# Patient Record
Sex: Female | Born: 1985 | Race: Black or African American | Hispanic: No | Marital: Married | Smoking: Never smoker
Health system: Southern US, Community
[De-identification: ages and names within clinical notes are randomized; demographics above are authoritative.]

## PROBLEM LIST (undated history)

## (undated) ENCOUNTER — Inpatient Hospital Stay (HOSPITAL_COMMUNITY): Payer: Self-pay

## (undated) DIAGNOSIS — N390 Urinary tract infection, site not specified: Secondary | ICD-10-CM

## (undated) DIAGNOSIS — F419 Anxiety disorder, unspecified: Secondary | ICD-10-CM

## (undated) DIAGNOSIS — T7840XA Allergy, unspecified, initial encounter: Secondary | ICD-10-CM

## (undated) DIAGNOSIS — N83209 Unspecified ovarian cyst, unspecified side: Secondary | ICD-10-CM

## (undated) DIAGNOSIS — D649 Anemia, unspecified: Secondary | ICD-10-CM

## (undated) DIAGNOSIS — F32A Depression, unspecified: Secondary | ICD-10-CM

## (undated) DIAGNOSIS — R519 Headache, unspecified: Secondary | ICD-10-CM

## (undated) DIAGNOSIS — K219 Gastro-esophageal reflux disease without esophagitis: Secondary | ICD-10-CM

## (undated) DIAGNOSIS — M1990 Unspecified osteoarthritis, unspecified site: Secondary | ICD-10-CM

## (undated) HISTORY — PX: TONSILLECTOMY: SUR1361

## (undated) HISTORY — PX: WISDOM TOOTH EXTRACTION: SHX21

## (undated) HISTORY — DX: Gastro-esophageal reflux disease without esophagitis: K21.9

## (undated) HISTORY — DX: Allergy, unspecified, initial encounter: T78.40XA

## (undated) HISTORY — DX: Anxiety disorder, unspecified: F41.9

## (undated) HISTORY — DX: Depression, unspecified: F32.A

## (undated) HISTORY — DX: Unspecified osteoarthritis, unspecified site: M19.90

---
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
## 2014-08-28 DIAGNOSIS — K649 Unspecified hemorrhoids: Secondary | ICD-10-CM

## 2014-08-28 HISTORY — DX: Unspecified hemorrhoids: K64.9

## 2014-11-19 DIAGNOSIS — K649 Unspecified hemorrhoids: Secondary | ICD-10-CM | POA: Insufficient documentation

## 2021-04-28 DIAGNOSIS — Z419 Encounter for procedure for purposes other than remedying health state, unspecified: Secondary | ICD-10-CM | POA: Diagnosis not present

## 2021-05-28 DIAGNOSIS — Z419 Encounter for procedure for purposes other than remedying health state, unspecified: Secondary | ICD-10-CM | POA: Diagnosis not present

## 2021-06-28 DIAGNOSIS — Z419 Encounter for procedure for purposes other than remedying health state, unspecified: Secondary | ICD-10-CM | POA: Diagnosis not present

## 2021-07-28 DIAGNOSIS — Z419 Encounter for procedure for purposes other than remedying health state, unspecified: Secondary | ICD-10-CM | POA: Diagnosis not present

## 2021-07-29 DIAGNOSIS — Z131 Encounter for screening for diabetes mellitus: Secondary | ICD-10-CM | POA: Diagnosis not present

## 2021-07-29 DIAGNOSIS — R5383 Other fatigue: Secondary | ICD-10-CM | POA: Diagnosis not present

## 2021-07-29 DIAGNOSIS — Z7182 Exercise counseling: Secondary | ICD-10-CM | POA: Diagnosis not present

## 2021-07-29 DIAGNOSIS — Z0189 Encounter for other specified special examinations: Secondary | ICD-10-CM | POA: Diagnosis not present

## 2021-07-29 DIAGNOSIS — Z13220 Encounter for screening for lipoid disorders: Secondary | ICD-10-CM | POA: Diagnosis not present

## 2021-07-29 DIAGNOSIS — Z113 Encounter for screening for infections with a predominantly sexual mode of transmission: Secondary | ICD-10-CM | POA: Diagnosis not present

## 2021-07-29 DIAGNOSIS — Z713 Dietary counseling and surveillance: Secondary | ICD-10-CM | POA: Diagnosis not present

## 2021-07-29 DIAGNOSIS — F419 Anxiety disorder, unspecified: Secondary | ICD-10-CM | POA: Diagnosis not present

## 2021-07-29 DIAGNOSIS — Z130 Encounter for screening for diseases of the blood and blood-forming organs and certain disorders involving the immune mechanism: Secondary | ICD-10-CM | POA: Diagnosis not present

## 2021-07-29 DIAGNOSIS — D509 Iron deficiency anemia, unspecified: Secondary | ICD-10-CM | POA: Diagnosis not present

## 2021-07-29 DIAGNOSIS — Z114 Encounter for screening for human immunodeficiency virus [HIV]: Secondary | ICD-10-CM | POA: Diagnosis not present

## 2021-07-29 DIAGNOSIS — Z1329 Encounter for screening for other suspected endocrine disorder: Secondary | ICD-10-CM | POA: Diagnosis not present

## 2021-08-01 DIAGNOSIS — Z3009 Encounter for other general counseling and advice on contraception: Secondary | ICD-10-CM | POA: Diagnosis not present

## 2021-08-01 DIAGNOSIS — Z3202 Encounter for pregnancy test, result negative: Secondary | ICD-10-CM | POA: Diagnosis not present

## 2021-08-01 DIAGNOSIS — Z01419 Encounter for gynecological examination (general) (routine) without abnormal findings: Secondary | ICD-10-CM | POA: Diagnosis not present

## 2021-08-01 DIAGNOSIS — N946 Dysmenorrhea, unspecified: Secondary | ICD-10-CM | POA: Diagnosis not present

## 2021-08-26 ENCOUNTER — Other Ambulatory Visit: Payer: Self-pay | Admitting: Family

## 2021-08-26 ENCOUNTER — Other Ambulatory Visit (HOSPITAL_COMMUNITY): Payer: Self-pay | Admitting: Family

## 2021-08-26 DIAGNOSIS — N946 Dysmenorrhea, unspecified: Secondary | ICD-10-CM

## 2021-08-28 DIAGNOSIS — Z419 Encounter for procedure for purposes other than remedying health state, unspecified: Secondary | ICD-10-CM | POA: Diagnosis not present

## 2021-08-28 NOTE — L&D Delivery Note (Signed)
Delivery Note 36 y.o. G3P1011 at [redacted]w[redacted]d admitted for spontaneous onset of labor, augmented with oxytcoin and AROM.  At 3:04 PM a viable female was delivered via Vaginal, Spontaneous (Presentation: Right Occiput Anterior).  APGAR: 8, 9; weight 7 lb 5.8 oz (3340 g).   Placenta status: Spontaneous, Intact.  Cord: 3 vessels with the following complications: None.  Cord pH: not collected  Anesthesia: Epidural Episiotomy: None Lacerations: None Suture Repair:  none Est. Blood Loss (mL): 17  Mom to postpartum.  Baby to Couplet care / Skin to Skin.  Liliane Channel MD MPH OB Fellow, [place] [facility] [place] 06/06/2022

## 2021-09-01 ENCOUNTER — Ambulatory Visit (HOSPITAL_COMMUNITY): Payer: Medicaid Other

## 2021-09-06 DIAGNOSIS — Z3009 Encounter for other general counseling and advice on contraception: Secondary | ICD-10-CM | POA: Diagnosis not present

## 2021-09-06 DIAGNOSIS — Z3200 Encounter for pregnancy test, result unknown: Secondary | ICD-10-CM | POA: Diagnosis not present

## 2021-09-06 DIAGNOSIS — Z113 Encounter for screening for infections with a predominantly sexual mode of transmission: Secondary | ICD-10-CM | POA: Diagnosis not present

## 2021-09-13 ENCOUNTER — Ambulatory Visit (HOSPITAL_COMMUNITY): Payer: Medicaid Other

## 2021-09-14 ENCOUNTER — Ambulatory Visit (HOSPITAL_COMMUNITY)
Admission: RE | Admit: 2021-09-14 | Discharge: 2021-09-14 | Disposition: A | Discharge status: Home / Self Care | Payer: PRIVATE HEALTH INSURANCE | Source: Ambulatory Visit | Attending: Family | Admitting: Family

## 2021-09-14 ENCOUNTER — Other Ambulatory Visit: Payer: Self-pay

## 2021-09-14 DIAGNOSIS — N946 Dysmenorrhea, unspecified: Secondary | ICD-10-CM | POA: Insufficient documentation

## 2021-09-14 DIAGNOSIS — N888 Other specified noninflammatory disorders of cervix uteri: Secondary | ICD-10-CM | POA: Diagnosis not present

## 2021-09-28 DIAGNOSIS — Z419 Encounter for procedure for purposes other than remedying health state, unspecified: Secondary | ICD-10-CM | POA: Diagnosis not present

## 2021-10-21 DIAGNOSIS — Z3201 Encounter for pregnancy test, result positive: Secondary | ICD-10-CM | POA: Diagnosis not present

## 2021-10-21 DIAGNOSIS — F419 Anxiety disorder, unspecified: Secondary | ICD-10-CM | POA: Diagnosis not present

## 2021-10-26 DIAGNOSIS — Z419 Encounter for procedure for purposes other than remedying health state, unspecified: Secondary | ICD-10-CM | POA: Diagnosis not present

## 2021-10-28 ENCOUNTER — Inpatient Hospital Stay (HOSPITAL_COMMUNITY)
Admission: AD | Admit: 2021-10-28 | Discharge: 2021-10-28 | Disposition: A | Discharge status: Home / Self Care | Payer: PRIVATE HEALTH INSURANCE | Attending: Obstetrics and Gynecology | Admitting: Obstetrics and Gynecology

## 2021-10-28 ENCOUNTER — Encounter (HOSPITAL_COMMUNITY): Payer: Self-pay

## 2021-10-28 DIAGNOSIS — O219 Vomiting of pregnancy, unspecified: Secondary | ICD-10-CM | POA: Insufficient documentation

## 2021-10-28 DIAGNOSIS — Z3A01 Less than 8 weeks gestation of pregnancy: Secondary | ICD-10-CM | POA: Diagnosis not present

## 2021-10-28 HISTORY — DX: Anemia, unspecified: D64.9

## 2021-10-28 LAB — URINALYSIS, ROUTINE W REFLEX MICROSCOPIC
Bilirubin Urine: NEGATIVE
Glucose, UA: NEGATIVE mg/dL
Hgb urine dipstick: NEGATIVE
Ketones, ur: NEGATIVE mg/dL
Leukocytes,Ua: NEGATIVE
Nitrite: NEGATIVE
Protein, ur: NEGATIVE mg/dL
Specific Gravity, Urine: 1.012 (ref 1.005–1.030)
pH: 6 (ref 5.0–8.0)

## 2021-10-28 LAB — POCT PREGNANCY, URINE: Preg Test, Ur: POSITIVE — AB

## 2021-10-28 MED ORDER — ACETAMINOPHEN 500 MG PO TABS
1000.0000 mg | ORAL_TABLET | Freq: Once | ORAL | Status: AC
  Administered 2021-10-28: 1000 mg via ORAL
  Filled 2021-10-28: qty 2

## 2021-10-28 MED ORDER — BONJESTA 20-20 MG PO TBCR: 1.0000 | EXTENDED_RELEASE_TABLET | Freq: Every day | ORAL | 1 refills | Status: DC

## 2021-10-28 MED ORDER — METOCLOPRAMIDE HCL 10 MG PO TABS: 10.0000 mg | ORAL_TABLET | Freq: Four times a day (QID) | ORAL | 0 refills | Status: DC

## 2021-10-28 MED ORDER — PROMETHAZINE HCL 25 MG PO TABS
25.0000 mg | ORAL_TABLET | Freq: Once | ORAL | Status: AC
Start: 2021-10-28 — End: 2021-10-28
  Administered 2021-10-28: 25 mg via ORAL
  Filled 2021-10-28: qty 1

## 2021-10-28 MED ORDER — METOCLOPRAMIDE HCL 10 MG PO TABS
10.0000 mg | ORAL_TABLET | Freq: Once | ORAL | Status: AC
  Administered 2021-10-28: 10 mg via ORAL
  Filled 2021-10-28: qty 1

## 2021-10-28 MED ORDER — PROMETHAZINE HCL 12.5 MG PO TABS: 12.5000 mg | ORAL_TABLET | Freq: Four times a day (QID) | ORAL | 0 refills | Status: DC | PRN

## 2021-10-28 NOTE — MAU Note (Signed)
..  Beverly Joseph is a 36 y.o. at Unknown here in MAU reporting: N/V since Monday. Pt reports having only one episode of emesis that was mostly acid, and just nauseous and spitting since. Pt eat a hard boiled egg, english muffin, and hot chocolate this am for breakfast, and ravioli for lunch. Pt states she has not drink any water, just 4 oz of cranberry. Not able to drink without being nauseous. Pt reports HA , not taking pain medication. Pt denies VB, LOF,  ?Oct 21, 2021 at [place] confirmed pregnancy. ?LMP: 09/08/2021 ?Onset of complaint: 0630 ?Pain score: 4 HA ?Vitals:  ? 10/28/21 1934  ?BP: (!) 101/54  ?Pulse: 79  ?Resp: 18  ?Temp: 98.7 ?F (37.1 ?C)  ?SpO2: 99%  ?   ? ?Lab orders placed from triage:  UA, POCT preg  ? ?

## 2021-10-28 NOTE — MAU Provider Note (Signed)
?History  ?  ? ?CSN: [id number] ? ?Arrival date and time: 10/28/21 1827 ? ? Event Date/Time  ? First Provider Initiated Contact with Patient 10/28/21 2006   ?  ? ?Chief Complaint  ?Patient presents with  ? Emesis  ? Nausea  ? ?Beverly Joseph is a 36 y.o. G3P10 at [redacted]w[redacted]d by Definite LMP of Sep 08, 2021 who receives care at [facility].  She presents  today for Emesis and Nausea.  She states her symptoms started a couple of days ago.  She states she hasn't had a lot of vomiting incidents, but a lot of smells and foods make her nauseous.  Patient states she has not tried any OTC medications and has thrown up once around 1830 and "it was just acid."  She states she also been having a lot of "straining and spitting."   ? ? ?OB History   ? ? Gravida  ?3  ? Para  ?1  ? Term  ?1  ? Preterm  ?   ? AB  ?1  ? Living  ?1  ?  ? ? SAB  ?1  ? IAB  ?   ? Ectopic  ?   ? Multiple  ?   ? Live Births  ?1  ?   ?  ?  ? ? ?Past Medical History:  ?Diagnosis Date  ? Anemia   ? ? ?Past Surgical History:  ?Procedure Laterality Date  ? WISDOM TOOTH EXTRACTION    ? ? ?History reviewed. No pertinent family history. ? ?Social History  ? ?Tobacco Use  ? Smoking status: Never  ? Smokeless tobacco: Never  ?Substance Use Topics  ? Alcohol use: Never  ? Drug use: Never  ? ? ?Allergies:  ?Allergies  ?Allergen Reactions  ? Penicillins Nausea And Vomiting  ? ? ?Medications Prior to Admission  ?Medication Sig Dispense Refill Last Dose  ? ferrous sulfate 325 (65 FE) MG EC tablet Take 325 mg by mouth 3 (three) times daily with meals.   10/28/2021  ? Prenatal Vit-Fe Fumarate-FA (PRENATAL MULTIVITAMIN) TABS tablet Take 1 tablet by mouth daily at 12 noon.   10/28/2021  ? ? ?Review of Systems  ?Constitutional:  Negative for chills and fever.  ?Gastrointestinal:  Positive for nausea. Negative for abdominal pain and vomiting.  ?Genitourinary:  Negative for difficulty urinating, dysuria, vaginal bleeding and vaginal discharge.  ?Musculoskeletal:  Negative for back pain.   ?Neurological:  Positive for headaches. Negative for dizziness and light-headedness.  ?Physical Exam  ? ?Blood pressure (!) 102/55, pulse 69, temperature 98.7 ?F (37.1 ?C), temperature source Oral, resp. rate 14, height 5\' 5"  (1.651 m), weight 62.2 kg, last menstrual period 09/08/2021, SpO2 99 %. ? ?Physical Exam ?Vitals reviewed.  ?Constitutional:   ?   Appearance: Normal appearance.  ?HENT:  ?   Head: Normocephalic and atraumatic.  ?Eyes:  ?   Conjunctiva/sclera: Conjunctivae normal.  ?Cardiovascular:  ?   Rate and Rhythm: Normal rate.  ?   Pulses: Normal pulses.  ?Pulmonary:  ?   Effort: Pulmonary effort is normal. No respiratory distress.  ?   Breath sounds: Normal breath sounds.  ?Abdominal:  ?   General: Bowel sounds are normal.  ?   Palpations: Abdomen is soft.  ?   Tenderness: There is no abdominal tenderness.  ?Musculoskeletal:     ?   General: Normal range of motion.  ?   Cervical back: Normal range of motion.  ?Skin: ?   General: Skin is warm and dry.  ?  Neurological:  ?   Mental Status: She is alert and oriented to person, place, and time.  ?Psychiatric:     ?   Mood and Affect: Mood normal.  ? ? ?MAU Course  ?Procedures ?Results for orders placed or performed during the hospital encounter of 10/28/21 (from the past 24 hour(s))  ?Urinalysis, Routine w reflex microscopic     Status: None  ? Collection Time: 10/28/21  7:44 PM  ?Result Value Ref Range  ? Color, Urine YELLOW YELLOW  ? APPearance CLEAR CLEAR  ? Specific Gravity, Urine 1.012 1.005 - 1.030  ? pH 6.0 5.0 - 8.0  ? Glucose, UA NEGATIVE NEGATIVE mg/dL  ? Hgb urine dipstick NEGATIVE NEGATIVE  ? Bilirubin Urine NEGATIVE NEGATIVE  ? Ketones, ur NEGATIVE NEGATIVE mg/dL  ? Protein, ur NEGATIVE NEGATIVE mg/dL  ? Nitrite NEGATIVE NEGATIVE  ? Leukocytes,Ua NEGATIVE NEGATIVE  ?Pregnancy, urine POC     Status: Abnormal  ? Collection Time: 10/28/21  7:45 PM  ?Result Value Ref Range  ? Preg Test, Ur POSITIVE (A) NEGATIVE  ? ? ?MDM ?Exam ?Antiemetic ?Labs:  UA ?Assessment and Plan  ?36 year old ?G3P1011 at 7.1 weeks ?Nausea/Vomiting ? ?-Exam performed.  ?-Discussed treatment with oral medication. ?-Patient agreeable. ?-Reviewed usage of Reglan and Diclegis/Bonjesta at home. ?-Patient requests and given ice water and crackers. ?-Will monitor and reassess.  ? ?Cherre Robins, MSN, CNM ?10/28/2021, 8:06 PM  ? ?Reassessment (9:38 PM) ?-Patient reports some improvement in symptoms. ?-Discussed usage phenergan for additional treatment. ?-Will also give tylenol for HA. ?-Patient without questions.  ? ?Reassessment (10:07 PM) ?-Patient reports improvement. ?-Discussed usage of medications at home. ?-Patient questions if she can eat seafood.  Informed okay, but should do so in moderation. ?-Eating plan provided in AVS. ?-Patient to follow up with primary office. ?-Rx for bonjesta, reglan, and phenergan sent to pharmacy on file.  ?-Encouraged to call primary office or return to MAU if symptoms worsen or with the onset of new symptoms. ?-Discharged to home in improved condition. ? ? ?Cherre Robins MSN, CNM ?Advanced Practice Provider, Center for [employer] ? ?

## 2021-11-18 ENCOUNTER — Encounter (HOSPITAL_COMMUNITY): Payer: Self-pay | Admitting: Obstetrics and Gynecology

## 2021-11-18 ENCOUNTER — Other Ambulatory Visit: Payer: Self-pay

## 2021-11-18 ENCOUNTER — Inpatient Hospital Stay (HOSPITAL_COMMUNITY)
Admission: AD | Admit: 2021-11-18 | Discharge: 2021-11-18 | Disposition: A | Discharge status: Home / Self Care | Payer: PRIVATE HEALTH INSURANCE | Attending: Obstetrics and Gynecology | Admitting: Obstetrics and Gynecology

## 2021-11-18 DIAGNOSIS — R002 Palpitations: Secondary | ICD-10-CM | POA: Insufficient documentation

## 2021-11-18 DIAGNOSIS — O26891 Other specified pregnancy related conditions, first trimester: Secondary | ICD-10-CM | POA: Diagnosis not present

## 2021-11-18 DIAGNOSIS — A084 Viral intestinal infection, unspecified: Secondary | ICD-10-CM

## 2021-11-18 DIAGNOSIS — J069 Acute upper respiratory infection, unspecified: Secondary | ICD-10-CM

## 2021-11-18 DIAGNOSIS — N3090 Cystitis, unspecified without hematuria: Secondary | ICD-10-CM

## 2021-11-18 DIAGNOSIS — R141 Gas pain: Secondary | ICD-10-CM

## 2021-11-18 DIAGNOSIS — R197 Diarrhea, unspecified: Secondary | ICD-10-CM | POA: Insufficient documentation

## 2021-11-18 DIAGNOSIS — Z3A10 10 weeks gestation of pregnancy: Secondary | ICD-10-CM | POA: Diagnosis not present

## 2021-11-18 DIAGNOSIS — J029 Acute pharyngitis, unspecified: Secondary | ICD-10-CM | POA: Diagnosis not present

## 2021-11-18 DIAGNOSIS — R519 Headache, unspecified: Secondary | ICD-10-CM | POA: Insufficient documentation

## 2021-11-18 DIAGNOSIS — Z20822 Contact with and (suspected) exposure to covid-19: Secondary | ICD-10-CM | POA: Diagnosis not present

## 2021-11-18 DIAGNOSIS — Z880 Allergy status to penicillin: Secondary | ICD-10-CM | POA: Diagnosis not present

## 2021-11-18 DIAGNOSIS — O219 Vomiting of pregnancy, unspecified: Secondary | ICD-10-CM | POA: Diagnosis not present

## 2021-11-18 DIAGNOSIS — R12 Heartburn: Secondary | ICD-10-CM | POA: Diagnosis not present

## 2021-11-18 DIAGNOSIS — R1010 Upper abdominal pain, unspecified: Secondary | ICD-10-CM | POA: Insufficient documentation

## 2021-11-18 DIAGNOSIS — R42 Dizziness and giddiness: Secondary | ICD-10-CM | POA: Insufficient documentation

## 2021-11-18 DIAGNOSIS — R0981 Nasal congestion: Secondary | ICD-10-CM | POA: Diagnosis not present

## 2021-11-18 LAB — URINALYSIS, ROUTINE W REFLEX MICROSCOPIC
Bilirubin Urine: NEGATIVE
Glucose, UA: NEGATIVE mg/dL
Hgb urine dipstick: NEGATIVE
Ketones, ur: 20 mg/dL — AB
Nitrite: NEGATIVE
Protein, ur: 30 mg/dL — AB
Specific Gravity, Urine: 1.028 (ref 1.005–1.030)
pH: 5 (ref 5.0–8.0)

## 2021-11-18 LAB — RESP PANEL BY RT-PCR (FLU A&B, COVID) ARPGX2
Influenza A by PCR: NEGATIVE
Influenza B by PCR: NEGATIVE
SARS Coronavirus 2 by RT PCR: NEGATIVE

## 2021-11-18 MED ORDER — DICYCLOMINE HCL 20 MG PO TABS: 20.0000 mg | ORAL_TABLET | ORAL | 0 refills | Status: DC | PRN

## 2021-11-18 MED ORDER — FLUTICASONE PROPIONATE 50 MCG/ACT NA SUSP: 2.0000 | Freq: Every day | NASAL | 0 refills | Status: DC

## 2021-11-18 MED ORDER — VITAMIN B-6 50 MG PO TABS: 50.0000 mg | ORAL_TABLET | Freq: Every day | ORAL | 0 refills | Status: AC

## 2021-11-18 MED ORDER — DICYCLOMINE HCL 20 MG PO TABS
20.0000 mg | ORAL_TABLET | Freq: Once | ORAL | Status: AC
  Administered 2021-11-18: 20 mg via ORAL
  Filled 2021-11-18: qty 1

## 2021-11-18 MED ORDER — FAMOTIDINE 20 MG PO TABS
20.0000 mg | ORAL_TABLET | Freq: Once | ORAL | Status: AC
  Administered 2021-11-18: 20 mg via ORAL
  Filled 2021-11-18: qty 1

## 2021-11-18 MED ORDER — ONDANSETRON 4 MG PO TBDP
4.0000 mg | ORAL_TABLET | Freq: Once | ORAL | Status: AC
  Administered 2021-11-18: 4 mg via ORAL
  Filled 2021-11-18: qty 1

## 2021-11-18 MED ORDER — CEFADROXIL 500 MG PO CAPS
500.0000 mg | ORAL_CAPSULE | Freq: Two times a day (BID) | ORAL | Status: DC
  Administered 2021-11-18: 500 mg via ORAL
  Filled 2021-11-18: qty 1

## 2021-11-18 MED ORDER — LORATADINE 10 MG PO TABS
10.0000 mg | ORAL_TABLET | Freq: Every day | ORAL | 0 refills | Status: DC
Start: 2021-11-19 — End: 2024-04-21

## 2021-11-18 MED ORDER — ONDANSETRON HCL 4 MG PO TABS: 4.0000 mg | ORAL_TABLET | Freq: Every day | ORAL | 0 refills | Status: AC | PRN

## 2021-11-18 MED ORDER — CEFADROXIL 500 MG PO CAPS: 500.0000 mg | ORAL_CAPSULE | Freq: Two times a day (BID) | ORAL | 0 refills | Status: AC

## 2021-11-18 MED ORDER — FAMOTIDINE 20 MG PO TABS: 20.0000 mg | ORAL_TABLET | Freq: Two times a day (BID) | ORAL | 1 refills | Status: DC

## 2021-11-18 MED ORDER — LORATADINE 10 MG PO TABS
10.0000 mg | ORAL_TABLET | Freq: Every day | ORAL | Status: DC
  Administered 2021-11-18: 10 mg via ORAL
  Filled 2021-11-18: qty 1

## 2021-11-18 MED ORDER — FLUTICASONE PROPIONATE 50 MCG/ACT NA SUSP
2.0000 | Freq: Every day | NASAL | Status: DC
  Administered 2021-11-18: 2 via NASAL
  Filled 2021-11-18: qty 16

## 2021-11-18 NOTE — MAU Provider Note (Addendum)
Chief Complaint:  Headache, Diarrhea, and Nausea ? ? Event Date/Time ? First Provider Initiated Contact with Patient 11/18/21 1839   ?  ?HPI: Beverly Joseph is a 36 y.o. G3P1011 at [redacted]w[redacted]d who presents to maternity admissions reporting a 1 day history of diarrhea, rhinorrhea, upper sinus pressure, and sore dry throat. Denies vaginal bleeding, leaking of fluid, fever (took temperature at home and was 97.1 F), and falls. ? ?Yesterday around noon, the patient noted a runny nose. Soon after, she started having diarrhea and sore throat. Later in the evening she started feeling a lot of pressure in her sinuses and especially her left ear, which was also painful. Today, she has been feeling popping in her left ear. The patient also reports nausea and 1 episode of vomiting this morning, which is has been going on throughout her pregnancy. She has had 5 episodes of diarrhea today. She notes palpitations when standing, felt some dizziness and some vision darkening. ? ?The patient has a 59 year old son who she reports also had diarrhea last night. 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 ? ?She has not tried any treatments for her symptoms yet. She has only had 1 bottle of water to drink today. She had breakfast (egg, muffin, banana, tea) and cup soup for lunch. ? ?Pregnancy Course: ?Past Medical History:  ?Diagnosis Date  ? Anemia   ? ?OB History  ?Gravida Para Term Preterm AB Living  ?3 1 1   1 1   ?SAB IAB Ectopic Multiple Live Births  ?1       1  ?  ?# Outcome Date GA Lbr Len/2nd Weight Sex Delivery Anes PTL Lv  ?3 Current           ?2 Term 2016     Vag-Spont   LIV  ?1 SAB 2010          ? ?Past Surgical History:  ?Procedure Laterality Date  ? TONSILLECTOMY    ? WISDOM TOOTH EXTRACTION    ? ?History reviewed. No pertinent family history. ?Social History  ? ?Tobacco Use  ? Smoking status: Never  ? Smokeless tobacco: Never  ?Vaping Use  ? Vaping Use: Never used  ?Substance Use Topics  ? Alcohol use: Never  ? Drug use: Never   ? ?Allergies  ?Allergen Reactions  ? Latex   ? Penicillins Nausea And Vomiting  ? ?Medications Prior to Admission  ?Medication Sig Dispense Refill Last Dose  ? metoCLOPramide (REGLAN) 10 MG tablet Take 1 tablet (10 mg total) by mouth every 6 (six) hours. 30 tablet 0 Past Month  ? Prenatal Vit-Fe Fumarate-FA (PRENATAL MULTIVITAMIN) TABS tablet Take 1 tablet by mouth daily at 12 noon.   11/17/2021  ? promethazine (PHENERGAN) 12.5 MG tablet Take 1 tablet (12.5 mg total) by mouth every 6 (six) hours as needed for nausea or vomiting. 30 tablet 0 Past Month  ? Doxylamine-Pyridoxine ER (BONJESTA) 20-20 MG TBCR Take 1 tablet by mouth at bedtime. 60 tablet 1   ? ferrous sulfate 325 (65 FE) MG EC tablet Take 325 mg by mouth 3 (three) times daily with meals.     ? ? ?I have reviewed patient's Past Medical Hx, Surgical Hx, Family Hx, Social Hx, medications and allergies.  ? ?ROS:  ?Review of Systems  ?Constitutional:  Positive for fatigue. Negative for chills and fever.  ?HENT:  Positive for congestion, ear pain, rhinorrhea, sinus pressure and sore throat.   ?Eyes:  Negative for pain.  ?Respiratory:  Negative  for shortness of breath.   ?Cardiovascular:  Negative for leg swelling.  ?Gastrointestinal:  Positive for abdominal pain, diarrhea (5 episodes), nausea and vomiting (1 episode).  ?Genitourinary:  Negative for flank pain, pelvic pain, vaginal bleeding and vaginal discharge.  ?Musculoskeletal:  Negative for neck stiffness.  ?Skin:  Negative for rash.  ?Neurological:  Positive for dizziness. Negative for headaches.  ? ?Physical Exam  ?Patient Vitals for the past 24 hrs: ? BP Temp Temp src Pulse Resp SpO2  ?11/18/21 2204 (!) 104/56 -- -- 80 -- --  ?11/18/21 1833 (!) 108/56 98.9 ?F (37.2 ?C) Oral (!) 102 16 100 %  ? ? ?Constitutional: Well-developed female, uncomfortable in chair, speaking in shortened sentences. ?HEENT: No TTP over maxillary and frontal sinuses. No tonsillar exudate. Mild erythema in nasopharynx. Nonbulging  and smooth TMs bilaterally. Mild erythema surrounding left TM. ?Cardiovascular: Regular S1/S2. No rubs, murmurs, or gallops. ?Respiratory: Normal work of breathing on room air, lung sounds clear to auscultation bilaterally. ?GI: Abd soft, TTP in epigastrium, gravid appropriate for gestational age. ?MS: Extremities nontender, no edema, normal ROM. ?Neurologic: Alert and oriented x 4. ?GU: No CVA tenderness. ?   ?  ?Labs: ?Results for orders placed or performed during the hospital encounter of 11/18/21 (from the past 24 hour(s))  ?Urinalysis, Routine w reflex microscopic Urine, Clean Catch     Status: Abnormal  ? Collection Time: 11/18/21  6:04 PM  ?Result Value Ref Range  ? Color, Urine AMBER (A) YELLOW  ? APPearance CLOUDY (A) CLEAR  ? Specific Gravity, Urine 1.028 1.005 - 1.030  ? pH 5.0 5.0 - 8.0  ? Glucose, UA NEGATIVE NEGATIVE mg/dL  ? Hgb urine dipstick NEGATIVE NEGATIVE  ? Bilirubin Urine NEGATIVE NEGATIVE  ? Ketones, ur 20 (A) NEGATIVE mg/dL  ? Protein, ur 30 (A) NEGATIVE mg/dL  ? Nitrite NEGATIVE NEGATIVE  ? Leukocytes,Ua MODERATE (A) NEGATIVE  ? RBC / HPF 6-10 0 - 5 RBC/hpf  ? WBC, UA 6-10 0 - 5 WBC/hpf  ? Bacteria, UA MANY (A) NONE SEEN  ? Squamous Epithelial / LPF 0-5 0 - 5  ? Mucus PRESENT   ? ? ?Imaging:  ?No results found. ? ?MAU Course: ?Orders Placed This Encounter  ?Procedures  ? Resp Panel by RT-PCR (Flu A&B, Covid) Nasopharyngeal Swab  ? OB Urine Culture  ? Urinalysis, Routine w reflex microscopic Urine, Clean Catch  ? Orthostatic vital signs  ? ?Meds ordered this encounter  ?Medications  ? ondansetron (ZOFRAN-ODT) disintegrating tablet 4 mg  ? famotidine (PEPCID) tablet 20 mg  ? dicyclomine (BENTYL) tablet 20 mg  ? fluticasone (FLONASE) 50 MCG/ACT nasal spray 2 spray  ? loratadine (CLARITIN) tablet 10 mg  ? cefadroxil (DURICEF) capsule 500 mg  ? ? ?MDM: ?G3P1011 at [redacted]w[redacted]d who presents to maternity admissions reporting a 1 day history of diarrhea, rhinorrhea, upper sinus pressure, and sore dry  throat. Likely viral gastroenteritis and viral upper respiratory infection in addition to suspected UTI (cystitis) ? ?Assessment & Plan: ?Cystitis ?Patient with UA with leukocytes and bacteria. With diffuse abdominal discomfort. No urinary symptoms but given abdominal discomfort and urine will treat. Patient with PCN allergy (N/V). Discussed with pharmacy and should likely still tolerate Duricef. Will trial dose here and as long as tolerates will DC home with 5 day course. ? ?2. Upper abdominal pain ?3. Diarrhea ?Symptoms and physical exam and time course consistent with likely viral gastroenteritis.  ?- symptomatic treatment with bentyl, tylenol ?- also famotidine for heart burn ?-  zofran for nausea ? ?4. Upper respiratory symptoms ?Prescribed Claritin and fluticasone to alleviate nasal and sinus congestion.  ? ? ? ?[facility] doctorhitarev, Medical Student ?[employer] [facility] ? ?GME ATTESTATION:  ?I saw and evaluated the patient. I agree with the findings and the plan of care as documented in the student?s note and made all necessary edits. ? ?Warner MccreedyAnuka Laiylah Roettger, MD, MPH ?OB Fellow, Faculty Practice ?[place], [facility] ?11/18/2021 10:43 PM ? ?

## 2021-11-18 NOTE — Discharge Instructions (Signed)
You came to the MAU because  you had runny nose and congestion, upper abdominal pain, and diarrhea. We think you have a viral infection. We treated with medications to help you feel better and sent them to your pharmacy.  ?

## 2021-11-18 NOTE — MAU Note (Signed)
Beverly Joseph is a 36 y.o. at [redacted]w[redacted]d here in MAU reporting: started having diarrhea and a runny nose yesterday. Negative covid test at home yesterday. Also vomiting and overall feels week. Mild headache.  ? ?4 episodes of diarrhea since it started ?1 episode of emesis ? ?Onset of complaint: yesterday ? ?Pain score: 6/10 ? ?Vitals:  ? 11/18/21 1833  ?BP: (!) 108/56  ?Pulse: (!) 102  ?Resp: 16  ?Temp: 98.9 ?F (37.2 ?C)  ?SpO2: 100%  ?   ?FHT:180 ? ?Lab orders placed from triage: UA ? ?

## 2021-11-21 LAB — CULTURE, OB URINE: Culture: NO GROWTH

## 2021-11-26 DIAGNOSIS — Z419 Encounter for procedure for purposes other than remedying health state, unspecified: Secondary | ICD-10-CM | POA: Diagnosis not present

## 2021-11-28 DIAGNOSIS — F32A Depression, unspecified: Secondary | ICD-10-CM | POA: Diagnosis not present

## 2021-11-28 DIAGNOSIS — R5383 Other fatigue: Secondary | ICD-10-CM | POA: Diagnosis not present

## 2021-11-28 DIAGNOSIS — K648 Other hemorrhoids: Secondary | ICD-10-CM | POA: Diagnosis not present

## 2021-11-28 DIAGNOSIS — O09511 Supervision of elderly primigravida, first trimester: Secondary | ICD-10-CM | POA: Diagnosis not present

## 2021-11-28 DIAGNOSIS — K5900 Constipation, unspecified: Secondary | ICD-10-CM | POA: Diagnosis not present

## 2021-11-28 DIAGNOSIS — F419 Anxiety disorder, unspecified: Secondary | ICD-10-CM | POA: Diagnosis not present

## 2021-11-28 DIAGNOSIS — D509 Iron deficiency anemia, unspecified: Secondary | ICD-10-CM | POA: Diagnosis not present

## 2021-11-28 DIAGNOSIS — N390 Urinary tract infection, site not specified: Secondary | ICD-10-CM | POA: Diagnosis not present

## 2021-11-28 DIAGNOSIS — M67442 Ganglion, left hand: Secondary | ICD-10-CM | POA: Diagnosis not present

## 2021-11-28 DIAGNOSIS — Z3481 Encounter for supervision of other normal pregnancy, first trimester: Secondary | ICD-10-CM | POA: Diagnosis not present

## 2021-11-28 DIAGNOSIS — Z862 Personal history of diseases of the blood and blood-forming organs and certain disorders involving the immune mechanism: Secondary | ICD-10-CM | POA: Diagnosis not present

## 2021-11-28 LAB — OB RESULTS CONSOLE ABO/RH: RH Type: POSITIVE

## 2021-11-28 LAB — OB RESULTS CONSOLE GC/CHLAMYDIA
Chlamydia: NEGATIVE
Neisseria Gonorrhea: NEGATIVE

## 2021-11-28 LAB — OB RESULTS CONSOLE RPR: RPR: NONREACTIVE

## 2021-11-28 LAB — OB RESULTS CONSOLE HEPATITIS B SURFACE ANTIGEN: Hepatitis B Surface Ag: NEGATIVE

## 2021-11-28 LAB — OB RESULTS CONSOLE HGB/HCT, BLOOD
HCT: 35 (ref 29–41)
Hemoglobin: 10.8

## 2021-11-28 LAB — OB RESULTS CONSOLE HIV ANTIBODY (ROUTINE TESTING): HIV: NONREACTIVE

## 2021-11-28 LAB — OB RESULTS CONSOLE RUBELLA ANTIBODY, IGM: Rubella: IMMUNE

## 2021-11-28 LAB — OB RESULTS CONSOLE ANTIBODY SCREEN: Antibody Screen: NEGATIVE

## 2021-11-28 LAB — HEPATITIS C ANTIBODY: HCV Ab: NEGATIVE

## 2021-11-28 LAB — OB RESULTS CONSOLE VARICELLA ZOSTER ANTIBODY, IGG: Varicella: IMMUNE

## 2021-11-28 LAB — OB RESULTS CONSOLE PLATELET COUNT: Platelets: 293

## 2021-12-06 ENCOUNTER — Other Ambulatory Visit: Payer: Self-pay

## 2021-12-26 DIAGNOSIS — N390 Urinary tract infection, site not specified: Secondary | ICD-10-CM | POA: Diagnosis not present

## 2021-12-26 DIAGNOSIS — Z3482 Encounter for supervision of other normal pregnancy, second trimester: Secondary | ICD-10-CM | POA: Diagnosis not present

## 2021-12-26 DIAGNOSIS — Z419 Encounter for procedure for purposes other than remedying health state, unspecified: Secondary | ICD-10-CM | POA: Diagnosis not present

## 2021-12-26 DIAGNOSIS — K648 Other hemorrhoids: Secondary | ICD-10-CM | POA: Diagnosis not present

## 2021-12-26 DIAGNOSIS — Z862 Personal history of diseases of the blood and blood-forming organs and certain disorders involving the immune mechanism: Secondary | ICD-10-CM | POA: Diagnosis not present

## 2022-01-18 ENCOUNTER — Other Ambulatory Visit: Payer: Self-pay | Admitting: Nurse Practitioner

## 2022-01-18 DIAGNOSIS — Z363 Encounter for antenatal screening for malformations: Secondary | ICD-10-CM

## 2022-01-18 DIAGNOSIS — Z3A19 19 weeks gestation of pregnancy: Secondary | ICD-10-CM

## 2022-01-18 DIAGNOSIS — Z818 Family history of other mental and behavioral disorders: Secondary | ICD-10-CM

## 2022-01-19 ENCOUNTER — Other Ambulatory Visit: Payer: Self-pay | Admitting: *Deleted

## 2022-01-19 ENCOUNTER — Ambulatory Visit (HOSPITAL_BASED_OUTPATIENT_CLINIC_OR_DEPARTMENT_OTHER): Payer: PRIVATE HEALTH INSURANCE

## 2022-01-19 ENCOUNTER — Ambulatory Visit: Payer: Self-pay | Admitting: Genetics

## 2022-01-19 ENCOUNTER — Ambulatory Visit: Payer: PRIVATE HEALTH INSURANCE | Attending: Nurse Practitioner

## 2022-01-19 ENCOUNTER — Ambulatory Visit: Payer: PRIVATE HEALTH INSURANCE | Admitting: *Deleted

## 2022-01-19 ENCOUNTER — Ambulatory Visit: Payer: PRIVATE HEALTH INSURANCE

## 2022-01-19 VITALS — BP 106/49 | HR 90 | Ht 63.5 in

## 2022-01-19 DIAGNOSIS — O288 Other abnormal findings on antenatal screening of mother: Secondary | ICD-10-CM

## 2022-01-19 DIAGNOSIS — Z363 Encounter for antenatal screening for malformations: Secondary | ICD-10-CM | POA: Diagnosis present

## 2022-01-19 DIAGNOSIS — Z3689 Encounter for other specified antenatal screening: Secondary | ICD-10-CM

## 2022-01-19 DIAGNOSIS — Z818 Family history of other mental and behavioral disorders: Secondary | ICD-10-CM | POA: Insufficient documentation

## 2022-01-19 DIAGNOSIS — Z3A19 19 weeks gestation of pregnancy: Secondary | ICD-10-CM | POA: Diagnosis not present

## 2022-01-19 NOTE — Progress Notes (Signed)
Name: Erin Hearing Indication: Son with Autism  DOB: 1986/03/07 Age: 36 y.o.   EDC: 06/15/2022 LMP: 09/08/2021 Referring Provider: Alberteen Spindle, NP  EGA: [redacted]w[redacted]d Genetic Counselor: Teena Dunk, MS, CGC  OB Hx: [id number] Date of Appointment: 01/19/2022  Accompanied by: Malissa Hippo Face to Face Time: 30 Minutes   Previous Testing Completed: Malaina previously completed Non-Invasive Prenatal Screening (NIPS) in this pregnancy. The result is low risk, consistent with a female fetus. This screening significantly reduces the risk that the current pregnancy has Down syndrome, Trisomy 7, Trisomy 26, and common sex chromosome conditions, however, the risk is not zero given the limitations of NIPS. Additionally, there are many genetic conditions that cannot be detected by NIPS.  Natassha previously completed carrier screening. She screened to not be a carrier for Cystic Fibrosis (CF), Spinal Muscular Atrophy (SMA), alpha thalassemia, and beta hemoglobinopathies. A negative result on carrier screening reduces the likelihood of being a carrier, however, does not entirely rule out the possibility.   Medical History:  This is Karsynn's 3rd pregnancy. She has one son. She has had one early loss. Reports she takes prenatal vitamins and multivitamins. Denies personal history of diabetes, high blood pressure, thyroid conditions, and seizures. Denies bleeding, infections, and fevers in this pregnancy. Denies using tobacco, alcohol, or street drugs in this pregnancy.   Family History: A pedigree was created and scanned into Epic under the Media tab. Averlee reports her 54 year old son Gary Fleet) has Autism.  She reports a paternal cousin with multiple body deformities, learning problems, and an oval shaped head. No other information about this individual and no medical records are available for genetic counseling to review. Genetic counseling was not able to quote the patient a risk for recurrence in the current pregnancy  given the small amount of information available regarding this individual.  Dakotta reports that she and her partner Charmian Muff) are from [place].  Denies Ashkenazi Jewish ancestry. Family history not remarkable for consanguinity, multiple spontaneous abortions, still births, or unexplained neonatal death.     Genetic Counseling:   Previous Child with Autism Spectrum Disorder. Ayerim reports her 40 year old son has a diagnosis of Autism. Autism Spectrum Disorder affects approximately 1-2% of the general population in the [place], [place], and [place]. Autism is a neurological and developmental disorder that affects how people interact with others, communicate, learn, and behave. Autism is known as a "spectrum" disorder because there is wide variation in the type and severity of symptoms people experience. Some interaction/communication behaviors people with Autism may have include making little or inconsistent eye contact, having trouble understanding another person's point of view, difficulties sharing in imaginative play or in making friends, etc. Some restrictive/repetitive behaviors people with Autism may exhibit include having a lasting intense interest in specific topics, being more sensitive or less sensitive than other people to sensory input, repeating words or phrases, etc. Mairen reports that her son has not been seen by pediatric genetics and has not had any genetic testing for conditions associated with Autism. We reviewed the benefits of having her son evaluated by pediatric genetics. We briefly discussed that genetic testing for individuals with a clinical diagnosis of Autism yields an explanation in about 20% of cases, and the remaining 80% of cases are left with unknown etiology. Based on empiric studies examining many families with children with Autism, the recurrence risk when one sibling is affected is approximately 3-10%, however, genetic counseling discussed with Alyasia that the recurrence  risk may increase  if her son has genetic testing and is subsequently diagnosed with a genetic condition. Per the ACOG Committee Opinion 691, any person with a family history of autism or intellectual disability of unknown etiology or underlying diagnosis should be offered carrier screening for Fragile X syndrome. Leighanne accepted carrier screening for Fragile X syndrome today. Results from this test will allow [place] to more accurately assess risk to have a child with this condition.     Patient Plan:  Proceed with: Carrier screening for Fragile X syndrome  Informed consent was obtained. All questions were answered.    Thank you for sharing in the care of [facility] with [place].  Please do not hesitate to contact us if you have any questions.  Staci Righter, MS, [facility]

## 2022-01-26 DIAGNOSIS — Z419 Encounter for procedure for purposes other than remedying health state, unspecified: Secondary | ICD-10-CM | POA: Diagnosis not present

## 2022-01-30 ENCOUNTER — Other Ambulatory Visit: Payer: Self-pay

## 2022-01-31 ENCOUNTER — Telehealth: Payer: Self-pay | Admitting: Genetics

## 2022-01-31 NOTE — Telephone Encounter (Signed)
[facility] with normal Fragile X carrier screening result. 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 All questions answered. No further tests are pending.

## 2022-02-16 ENCOUNTER — Other Ambulatory Visit: Payer: Self-pay | Admitting: *Deleted

## 2022-02-16 ENCOUNTER — Inpatient Hospital Stay (HOSPITAL_COMMUNITY)
Admission: AD | Admit: 2022-02-16 | Discharge: 2022-02-16 | Disposition: A | Discharge status: Home / Self Care | Payer: PRIVATE HEALTH INSURANCE | Attending: Obstetrics and Gynecology | Admitting: Obstetrics and Gynecology

## 2022-02-16 ENCOUNTER — Encounter: Payer: Self-pay | Admitting: *Deleted

## 2022-02-16 ENCOUNTER — Ambulatory Visit: Payer: PRIVATE HEALTH INSURANCE | Attending: Obstetrics and Gynecology

## 2022-02-16 ENCOUNTER — Ambulatory Visit: Payer: PRIVATE HEALTH INSURANCE | Admitting: *Deleted

## 2022-02-16 ENCOUNTER — Other Ambulatory Visit: Payer: Self-pay

## 2022-02-16 VITALS — BP 103/52 | HR 94

## 2022-02-16 DIAGNOSIS — Z362 Encounter for other antenatal screening follow-up: Secondary | ICD-10-CM

## 2022-02-16 DIAGNOSIS — B9789 Other viral agents as the cause of diseases classified elsewhere: Secondary | ICD-10-CM | POA: Diagnosis not present

## 2022-02-16 DIAGNOSIS — O283 Abnormal ultrasonic finding on antenatal screening of mother: Secondary | ICD-10-CM | POA: Diagnosis not present

## 2022-02-16 DIAGNOSIS — Z3689 Encounter for other specified antenatal screening: Secondary | ICD-10-CM | POA: Insufficient documentation

## 2022-02-16 DIAGNOSIS — Z3A23 23 weeks gestation of pregnancy: Secondary | ICD-10-CM | POA: Diagnosis not present

## 2022-02-16 DIAGNOSIS — Z20822 Contact with and (suspected) exposure to covid-19: Secondary | ICD-10-CM | POA: Diagnosis not present

## 2022-02-16 DIAGNOSIS — O09292 Supervision of pregnancy with other poor reproductive or obstetric history, second trimester: Secondary | ICD-10-CM | POA: Diagnosis not present

## 2022-02-16 DIAGNOSIS — J988 Other specified respiratory disorders: Secondary | ICD-10-CM

## 2022-02-16 DIAGNOSIS — J029 Acute pharyngitis, unspecified: Secondary | ICD-10-CM | POA: Insufficient documentation

## 2022-02-16 DIAGNOSIS — O99892 Other specified diseases and conditions complicating childbirth: Secondary | ICD-10-CM | POA: Insufficient documentation

## 2022-02-16 DIAGNOSIS — Z818 Family history of other mental and behavioral disorders: Secondary | ICD-10-CM | POA: Insufficient documentation

## 2022-02-16 LAB — RESP PANEL BY RT-PCR (FLU A&B, COVID) ARPGX2
Influenza A by PCR: NEGATIVE
Influenza B by PCR: NEGATIVE
SARS Coronavirus 2 by RT PCR: NEGATIVE

## 2022-02-16 LAB — GROUP A STREP BY PCR: Group A Strep by PCR: NOT DETECTED

## 2022-02-16 NOTE — MAU Note (Signed)
.  Beverly Joseph is a 36 y.o. at [redacted]w[redacted]d here in MAU reporting: she started having a cough on Tuesday and then throat started feeling sore later in the day. Denies any fever or body ache but is c/o mild headache. Reports coughing up a cream colored flem . Denies SOB    Onset of complaint: Tuesday Pain score: 6 Vitals:   02/16/22 1837  BP: (!) 107/53  Pulse: 91  Resp: 18  Temp: 98.4 F (36.9 C)  SpO2: 99%     FHT:145  Lab orders placed from triage:

## 2022-02-16 NOTE — Discharge Instructions (Signed)

## 2022-02-16 NOTE — MAU Provider Note (Cosign Needed)
History     [id number]  Arrival date and time: 02/16/22 1801    Chief Complaint  Patient presents with   Cough   Sore Throat     HPI Beverly Joseph is a 36 y.o. at [redacted]w[redacted]d who presents for sore throat & cough. Symptoms started on Tuesday. No sick contacts. Symptoms include sore throat, cough, and occasional headache. Has been treating symptoms with honey tea, turmeric, & tylenol. Cough is productive of green sputum.  Denies fever/chills, ear pain, sinus pain, shortness of breath, or chest pain. Denies abdominal pain, vaginal bleeding, or LOF. Positive fetal movement.    OB History     Gravida  3   Para  1   Term  1   Preterm      AB  1   Living  1      SAB  1   IAB      Ectopic      Multiple      Live Births  1           Past Medical History:  Diagnosis Date   Anemia     Past Surgical History:  Procedure Laterality Date   TONSILLECTOMY     WISDOM TOOTH EXTRACTION      Family History  Problem Relation Age of Onset   Asthma Sister     Allergies  Allergen Reactions   Latex    Penicillins Nausea And Vomiting    No current facility-administered medications on file prior to encounter.   Current Outpatient Medications on File Prior to Encounter  Medication Sig Dispense Refill   ferrous sulfate 325 (65 FE) MG EC tablet Take 325 mg by mouth 3 (three) times daily with meals.     fluticasone (FLONASE) 50 MCG/ACT nasal spray Place 2 sprays into both nostrils daily for 5 days. 1 g 0   loratadine (CLARITIN) 10 MG tablet Take 1 tablet (10 mg total) by mouth daily for 15 days. 15 tablet 0   Prenatal Vit-Fe Fumarate-FA (PRENATAL MULTIVITAMIN) TABS tablet Take 1 tablet by mouth daily at 12 noon.       ROS Pertinent positives and negative per HPI, all others reviewed and negative  Physical Exam   BP (!) 107/53   Pulse 91   Temp 98.4 F (36.9 C)   Resp 18   Ht 5' 3.5" (1.613 m)   LMP 09/08/2021 (Exact Date)   SpO2 99%   BMI 23.92 kg/m   Patient  Vitals for the past 24 hrs:  BP Temp Pulse Resp SpO2 Height  02/16/22 1837 (!) 107/53 98.4 F (36.9 C) 91 18 99 % 5' 3.5" (1.613 m)    Physical Exam Vitals and nursing note reviewed.  Constitutional:      General: She is not in acute distress.    Appearance: She is well-developed. She is not ill-appearing or toxic-appearing.  HENT:     Head: Normocephalic and atraumatic.     Right Ear: Tympanic membrane and ear canal normal. Tympanic membrane is not erythematous.     Left Ear: Tympanic membrane and ear canal normal. Tympanic membrane is not erythematous.     Mouth/Throat:     Mouth: Mucous membranes are moist.     Pharynx: Uvula midline. No oropharyngeal exudate or posterior oropharyngeal erythema.     Tonsils: No tonsillar exudate or tonsillar abscesses.  Eyes:     Conjunctiva/sclera: Conjunctivae normal.     Pupils: Pupils are equal, round, and reactive to light.  Cardiovascular:  Rate and Rhythm: Normal rate and regular rhythm.     Heart sounds: Normal heart sounds.  Pulmonary:     Effort: Pulmonary effort is normal. No respiratory distress.     Breath sounds: Normal breath sounds. No wheezing.  Musculoskeletal:     Cervical back: Normal range of motion and neck supple.  Lymphadenopathy:     Cervical: No cervical adenopathy.  Skin:    General: Skin is warm and dry.  Neurological:     Mental Status: She is alert.       Labs No results found for this or any previous visit (from the past 24 hour(s)).  Imaging  MAU Course  Procedures Lab Orders         Resp Panel by RT-PCR (Flu A&B, Covid) Anterior Nasal Swab         Group A Strep by PCR    No orders of the defined types were placed in this encounter.  Imaging Orders  No imaging studies ordered today    MDM FHT present via doppler  Vital signs stable & benign exam. Strep throat & respiratory swab collected. Discussed symptomatic treatment.  Assessment and Plan   1. Viral respiratory illness  -Given  list of OTC meds safe in pregnancy. Push fluids. Try mucinex & delsym.   2. Sore throat  -Sore throat sprays & lozenges -Will tx for strep if swab is positive.  3. [redacted] weeks gestation of pregnancy      Judeth Horn, NP 02/16/22 7:07 PM

## 2022-02-25 DIAGNOSIS — Z419 Encounter for procedure for purposes other than remedying health state, unspecified: Secondary | ICD-10-CM | POA: Diagnosis not present

## 2022-03-20 ENCOUNTER — Telehealth: Payer: Self-pay

## 2022-03-28 DIAGNOSIS — Z419 Encounter for procedure for purposes other than remedying health state, unspecified: Secondary | ICD-10-CM | POA: Diagnosis not present

## 2022-03-29 DIAGNOSIS — F4323 Adjustment disorder with mixed anxiety and depressed mood: Secondary | ICD-10-CM | POA: Diagnosis not present

## 2022-04-03 DIAGNOSIS — Z23 Encounter for immunization: Secondary | ICD-10-CM | POA: Diagnosis not present

## 2022-04-03 DIAGNOSIS — Z862 Personal history of diseases of the blood and blood-forming organs and certain disorders involving the immune mechanism: Secondary | ICD-10-CM | POA: Diagnosis not present

## 2022-04-03 DIAGNOSIS — B3731 Acute candidiasis of vulva and vagina: Secondary | ICD-10-CM | POA: Diagnosis not present

## 2022-04-03 DIAGNOSIS — D509 Iron deficiency anemia, unspecified: Secondary | ICD-10-CM | POA: Diagnosis not present

## 2022-04-03 DIAGNOSIS — Z3483 Encounter for supervision of other normal pregnancy, third trimester: Secondary | ICD-10-CM | POA: Diagnosis not present

## 2022-04-03 LAB — OB RESULTS CONSOLE RPR: RPR: NONREACTIVE

## 2022-04-03 LAB — OB RESULTS CONSOLE HGB/HCT, BLOOD
HCT: 31 (ref 29–41)
Hemoglobin: 9.6

## 2022-04-03 LAB — OB RESULTS CONSOLE HIV ANTIBODY (ROUTINE TESTING): HIV: NONREACTIVE

## 2022-04-04 DIAGNOSIS — F4323 Adjustment disorder with mixed anxiety and depressed mood: Secondary | ICD-10-CM | POA: Diagnosis not present

## 2022-04-12 ENCOUNTER — Ambulatory Visit: Payer: PRIVATE HEALTH INSURANCE | Admitting: *Deleted

## 2022-04-12 ENCOUNTER — Ambulatory Visit: Payer: PRIVATE HEALTH INSURANCE | Attending: Obstetrics and Gynecology

## 2022-04-12 VITALS — BP 102/48 | HR 84

## 2022-04-12 DIAGNOSIS — Z3689 Encounter for other specified antenatal screening: Secondary | ICD-10-CM | POA: Insufficient documentation

## 2022-04-12 DIAGNOSIS — Z3A30 30 weeks gestation of pregnancy: Secondary | ICD-10-CM | POA: Diagnosis not present

## 2022-04-12 DIAGNOSIS — O3510X0 Maternal care for (suspected) chromosomal abnormality in fetus, unspecified, not applicable or unspecified: Secondary | ICD-10-CM | POA: Diagnosis not present

## 2022-04-12 DIAGNOSIS — Z362 Encounter for other antenatal screening follow-up: Secondary | ICD-10-CM | POA: Insufficient documentation

## 2022-04-18 DIAGNOSIS — F4323 Adjustment disorder with mixed anxiety and depressed mood: Secondary | ICD-10-CM | POA: Diagnosis not present

## 2022-04-21 ENCOUNTER — Other Ambulatory Visit (HOSPITAL_COMMUNITY): Payer: Self-pay

## 2022-04-25 ENCOUNTER — Ambulatory Visit (HOSPITAL_COMMUNITY)
Admission: RE | Admit: 2022-04-25 | Discharge: 2022-04-25 | Disposition: A | Discharge status: Home / Self Care | Payer: PRIVATE HEALTH INSURANCE | Source: Ambulatory Visit | Attending: Obstetrics and Gynecology | Admitting: Obstetrics and Gynecology

## 2022-04-25 DIAGNOSIS — O99012 Anemia complicating pregnancy, second trimester: Secondary | ICD-10-CM | POA: Diagnosis not present

## 2022-04-25 DIAGNOSIS — Z3A23 23 weeks gestation of pregnancy: Secondary | ICD-10-CM | POA: Diagnosis not present

## 2022-04-25 MED ORDER — SODIUM CHLORIDE 0.9 % IV SOLN
500.0000 mg | INTRAVENOUS | Status: DC
  Administered 2022-04-25: 500 mg via INTRAVENOUS
  Filled 2022-04-25: qty 500

## 2022-04-28 DIAGNOSIS — Z419 Encounter for procedure for purposes other than remedying health state, unspecified: Secondary | ICD-10-CM | POA: Diagnosis not present

## 2022-05-04 ENCOUNTER — Other Ambulatory Visit (HOSPITAL_COMMUNITY): Payer: Self-pay | Admitting: *Deleted

## 2022-05-05 ENCOUNTER — Encounter (HOSPITAL_COMMUNITY)
Admission: RE | Admit: 2022-05-05 | Discharge: 2022-05-05 | Disposition: A | Discharge status: Home / Self Care | Payer: PRIVATE HEALTH INSURANCE | Source: Ambulatory Visit | Attending: Obstetrics and Gynecology | Admitting: Obstetrics and Gynecology

## 2022-05-05 DIAGNOSIS — O99019 Anemia complicating pregnancy, unspecified trimester: Secondary | ICD-10-CM | POA: Diagnosis not present

## 2022-05-05 DIAGNOSIS — Z3A00 Weeks of gestation of pregnancy not specified: Secondary | ICD-10-CM | POA: Insufficient documentation

## 2022-05-05 MED ORDER — SODIUM CHLORIDE 0.9 % IV SOLN
500.0000 mg | INTRAVENOUS | Status: DC
  Administered 2022-05-05: 500 mg via INTRAVENOUS
  Filled 2022-05-05: qty 500

## 2022-05-12 ENCOUNTER — Encounter (HOSPITAL_COMMUNITY)
Admission: RE | Admit: 2022-05-12 | Discharge: 2022-05-12 | Disposition: A | Discharge status: Home / Self Care | Payer: PRIVATE HEALTH INSURANCE | Source: Ambulatory Visit | Attending: Obstetrics and Gynecology | Admitting: Obstetrics and Gynecology

## 2022-05-12 DIAGNOSIS — Z3A00 Weeks of gestation of pregnancy not specified: Secondary | ICD-10-CM | POA: Diagnosis not present

## 2022-05-12 DIAGNOSIS — O99019 Anemia complicating pregnancy, unspecified trimester: Secondary | ICD-10-CM | POA: Diagnosis not present

## 2022-05-12 MED ORDER — SODIUM CHLORIDE 0.9 % IV SOLN
500.0000 mg | INTRAVENOUS | Status: DC
  Administered 2022-05-12: 500 mg via INTRAVENOUS
  Filled 2022-05-12: qty 500

## 2022-05-18 DIAGNOSIS — Z862 Personal history of diseases of the blood and blood-forming organs and certain disorders involving the immune mechanism: Secondary | ICD-10-CM | POA: Diagnosis not present

## 2022-05-18 DIAGNOSIS — Z113 Encounter for screening for infections with a predominantly sexual mode of transmission: Secondary | ICD-10-CM | POA: Diagnosis not present

## 2022-05-18 DIAGNOSIS — Z3483 Encounter for supervision of other normal pregnancy, third trimester: Secondary | ICD-10-CM | POA: Diagnosis not present

## 2022-05-18 LAB — OB RESULTS CONSOLE HIV ANTIBODY (ROUTINE TESTING): HIV: NONREACTIVE

## 2022-05-18 LAB — OB RESULTS CONSOLE GC/CHLAMYDIA
Chlamydia: NEGATIVE
Neisseria Gonorrhea: NEGATIVE

## 2022-05-18 LAB — OB RESULTS CONSOLE GBS: GBS: NEGATIVE

## 2022-05-28 DIAGNOSIS — Z419 Encounter for procedure for purposes other than remedying health state, unspecified: Secondary | ICD-10-CM | POA: Diagnosis not present

## 2022-06-05 ENCOUNTER — Other Ambulatory Visit: Payer: Self-pay

## 2022-06-05 ENCOUNTER — Inpatient Hospital Stay (HOSPITAL_COMMUNITY)
Admission: AD | Admit: 2022-06-05 | Discharge: 2022-06-07 | DRG: 807 | Disposition: A | Discharge status: Home / Self Care | Payer: PRIVATE HEALTH INSURANCE | Attending: Family Medicine | Admitting: Family Medicine

## 2022-06-05 ENCOUNTER — Encounter (HOSPITAL_COMMUNITY): Payer: Self-pay | Admitting: Family Medicine

## 2022-06-05 DIAGNOSIS — O9902 Anemia complicating childbirth: Principal | ICD-10-CM | POA: Diagnosis present

## 2022-06-05 DIAGNOSIS — Z880 Allergy status to penicillin: Secondary | ICD-10-CM

## 2022-06-05 DIAGNOSIS — O26893 Other specified pregnancy related conditions, third trimester: Secondary | ICD-10-CM | POA: Diagnosis not present

## 2022-06-05 DIAGNOSIS — Z3490 Encounter for supervision of normal pregnancy, unspecified, unspecified trimester: Principal | ICD-10-CM

## 2022-06-05 DIAGNOSIS — Z3A38 38 weeks gestation of pregnancy: Secondary | ICD-10-CM

## 2022-06-05 DIAGNOSIS — Z91040 Latex allergy status: Secondary | ICD-10-CM | POA: Diagnosis not present

## 2022-06-05 DIAGNOSIS — O99019 Anemia complicating pregnancy, unspecified trimester: Secondary | ICD-10-CM | POA: Diagnosis present

## 2022-06-05 DIAGNOSIS — Z3483 Encounter for supervision of other normal pregnancy, third trimester: Secondary | ICD-10-CM | POA: Diagnosis not present

## 2022-06-05 LAB — CBC WITH DIFFERENTIAL/PLATELET
Abs Immature Granulocytes: 0.04 10*3/uL (ref 0.00–0.07)
Basophils Absolute: 0 10*3/uL (ref 0.0–0.1)
Basophils Relative: 0 %
Eosinophils Absolute: 0 10*3/uL (ref 0.0–0.5)
Eosinophils Relative: 0 %
HCT: 34.6 % — ABNORMAL LOW (ref 36.0–46.0)
Hemoglobin: 11.2 g/dL — ABNORMAL LOW (ref 12.0–15.0)
Immature Granulocytes: 0 %
Lymphocytes Relative: 27 %
Lymphs Abs: 3.4 10*3/uL (ref 0.7–4.0)
MCH: 30.1 pg (ref 26.0–34.0)
MCHC: 32.4 g/dL (ref 30.0–36.0)
MCV: 93 fL (ref 80.0–100.0)
Monocytes Absolute: 0.8 10*3/uL (ref 0.1–1.0)
Monocytes Relative: 7 %
Neutro Abs: 8.2 10*3/uL — ABNORMAL HIGH (ref 1.7–7.7)
Neutrophils Relative %: 66 %
Platelets: 197 10*3/uL (ref 150–400)
RBC: 3.72 MIL/uL — ABNORMAL LOW (ref 3.87–5.11)
RDW: 15.9 % — ABNORMAL HIGH (ref 11.5–15.5)
WBC: 12.5 10*3/uL — ABNORMAL HIGH (ref 4.0–10.5)
nRBC: 0 % (ref 0.0–0.2)

## 2022-06-05 MED ORDER — LACTATED RINGERS IV SOLN
INTRAVENOUS | Status: DC
  Administered 2022-06-05: 1000 mL via INTRAVENOUS

## 2022-06-05 NOTE — MAU Note (Signed)
First call updated on pts progress. MD plan to observe another hour and recheck

## 2022-06-05 NOTE — MAU Note (Signed)
Pt updated on plan agreeable but concern not knowing if she is going to be admitted due to her sons care. Stated he is autistic and has to stay on a schedule.

## 2022-06-05 NOTE — MAU Note (Signed)
Beverly Joseph is a 36 y.o. at [redacted]w[redacted]d here in MAU reporting: she's having ctxs that were 2-3 minutes apart earlier this afternoon, but since 1700 the ctxs have spaced out and are further aprt.  Denies VB or LOF.  Endorses +FM. LMP: N/A Onset of complaint: today Pain score: 7 Vitals:   06/05/22 1816  BP: 109/63  Pulse: 92  Resp: 18  Temp: 98.1 F (36.7 C)  SpO2: 99%     RCV:ELFYBOFB secondary maternal apparel Lab orders placed from triage:   None

## 2022-06-05 NOTE — H&P (Shared)
OBSTETRIC ADMISSION HISTORY AND PHYSICAL  Ricca Melgarejo is a 36 y.o. female G3P1011 with IUP at [redacted]w[redacted]d by LMP presenting in SOL. She reports +FMs, No LOF, no VB, no blurry vision, headaches or peripheral edema, and RUQ pain.  She plans on breast feeding. She request POP for birth control. She received her prenatal care at [facility]: By LMP --->  Estimated Date of Delivery: 06/15/22  Sono:    @[redacted]w[redacted]d , CWD, normal anatomy, cephalic presentation, anterior lie, 1551g, 22% EFW   Prenatal History/Complications:  Anemia, received venifer x 2   Past Medical History: Past Medical History:  Diagnosis Date   Anemia     Past Surgical History: Past Surgical History:  Procedure Laterality Date   TONSILLECTOMY     WISDOM TOOTH EXTRACTION      Obstetrical History: OB History     Gravida  3   Para  1   Term  1   Preterm      AB  1   Living  1      SAB  1   IAB      Ectopic      Multiple      Live Births  1           Social History Social History   Socioeconomic History   Marital status: Married    Spouse name: Not on file   Number of children: Not on file   Years of education: Not on file   Highest education level: Not on file  Occupational History   Not on file  Tobacco Use   Smoking status: Never   Smokeless tobacco: Never  Vaping Use   Vaping Use: Never used  Substance and Sexual Activity   Alcohol use: Never   Drug use: Never   Sexual activity: Yes  Other Topics Concern   Not on file  Social History Narrative   Not on file   Social Determinants of Health   Financial Resource Strain: Not on file  Food Insecurity: Not on file  Transportation Needs: Not on file  Physical Activity: Not on file  Stress: Not on file  Social Connections: Not on file    Family History: Family History  Problem Relation Age of Onset   Asthma Sister     Allergies: Allergies  Allergen Reactions   Latex    Penicillins Nausea And Vomiting    Medications  Prior to Admission  Medication Sig Dispense Refill Last Dose   ferrous sulfate 325 (65 FE) MG EC tablet Take 325 mg by mouth 3 (three) times daily with meals.   06/04/2022   Prenatal Vit-Fe Fumarate-FA (PRENATAL MULTIVITAMIN) TABS tablet Take 1 tablet by mouth daily at 12 noon.   06/05/2022   fluticasone (FLONASE) 50 MCG/ACT nasal spray Place 2 sprays into both nostrils daily for 5 days. 1 g 0    loratadine (CLARITIN) 10 MG tablet Take 1 tablet (10 mg total) by mouth daily for 15 days. 15 tablet 0      Review of Systems   All systems reviewed and negative except as stated in HPI  Blood pressure 117/65, pulse 76, temperature 98.7 F (37.1 C), temperature source Oral, resp. rate 17, height 5\' 5"  (1.651 m), weight 79.2 kg, last menstrual period 09/08/2021, SpO2 99 %. General appearance: {general exam:16600} Lungs: clear to auscultation bilaterally Heart: regular rate and rhythm Abdomen: soft, non-tender; bowel sounds normal Pelvic: *** Extremities: Homans sign is negative, no sign of DVT DTR's ***  Presentation: {desc; fetal presentation:14558} Fetal monitoring{findings; monitor fetal heart monitor:31527} Uterine activity{Uterine contractions:31516} Dilation: 2 Effacement (%): 70 Station: -2 Exam by:: Ginnie Smart RN   Prenatal labs: ABO, Rh:  A Pos Antibody:  negative Rubella:  Immune RPR:   nonreactive HBsAg:   nonreactive HIV:   nonreactive GBS:   negative 1 hr Glucola 90 Genetic screening  negative Anatomy US wnl  Prenatal Transfer Tool  Maternal Diabetes: No Genetic Screening: Normal Maternal Ultrasounds/Referrals: Fetal renal pyelectasis, resolved  Fetal Ultrasounds or other Referrals:  None Maternal Substance Abuse:  No Significant Maternal Medications:  None Significant Maternal Lab Results:  Group B Strep negative Number of Prenatal Visits:greater than 3 verified prenatal visits Other Comments:  None  No results found for this or any previous visit (from the  past 24 hour(s)).  Patient Active Problem List   Diagnosis Date Noted   Family history of autism 01/19/2022    Assessment/Plan:  Aleathia Purdy is a 36 y.o. G3P1011 at [redacted]w[redacted]d here for SOL.   #Labor:SOL, FB, continue to monitor #Pain: *** #FWB: *** #ID:  *** #MOF: *** #MOC:*** #Circ:  Lockie Mola, MD  06/05/2022, 10:05 PM

## 2022-06-05 NOTE — MAU Note (Signed)
Pt to wheelchair for transfer to LD.

## 2022-06-06 ENCOUNTER — Inpatient Hospital Stay (HOSPITAL_COMMUNITY): Payer: PRIVATE HEALTH INSURANCE | Admitting: Anesthesiology

## 2022-06-06 DIAGNOSIS — O99019 Anemia complicating pregnancy, unspecified trimester: Secondary | ICD-10-CM | POA: Diagnosis present

## 2022-06-06 DIAGNOSIS — O9902 Anemia complicating childbirth: Secondary | ICD-10-CM | POA: Diagnosis not present

## 2022-06-06 DIAGNOSIS — Z3490 Encounter for supervision of normal pregnancy, unspecified, unspecified trimester: Principal | ICD-10-CM

## 2022-06-06 DIAGNOSIS — Z3A38 38 weeks gestation of pregnancy: Secondary | ICD-10-CM | POA: Diagnosis not present

## 2022-06-06 DIAGNOSIS — D649 Anemia, unspecified: Secondary | ICD-10-CM | POA: Diagnosis not present

## 2022-06-06 LAB — RPR: RPR Ser Ql: NONREACTIVE

## 2022-06-06 MED ORDER — TERBUTALINE SULFATE 1 MG/ML IJ SOLN: 0.2500 mg | Freq: Once | INTRAMUSCULAR | Status: DC | PRN

## 2022-06-06 MED ORDER — ONDANSETRON HCL 4 MG PO TABS: 4.0000 mg | ORAL_TABLET | ORAL | Status: DC | PRN

## 2022-06-06 MED ORDER — ACETAMINOPHEN 325 MG PO TABS: 650.0000 mg | ORAL_TABLET | ORAL | Status: DC | PRN

## 2022-06-06 MED ORDER — COCONUT OIL OIL: 1.0000 | TOPICAL_OIL | Status: DC | PRN

## 2022-06-06 MED ORDER — DIPHENHYDRAMINE HCL 25 MG PO CAPS: 25.0000 mg | ORAL_CAPSULE | Freq: Four times a day (QID) | ORAL | Status: DC | PRN

## 2022-06-06 MED ORDER — FENTANYL-BUPIVACAINE-NACL 0.5-0.125-0.9 MG/250ML-% EP SOLN
12.0000 mL/h | EPIDURAL | Status: DC | PRN
  Filled 2022-06-06: qty 250

## 2022-06-06 MED ORDER — BENZOCAINE-MENTHOL 20-0.5 % EX AERO: 1.0000 | INHALATION_SPRAY | CUTANEOUS | Status: DC | PRN

## 2022-06-06 MED ORDER — SODIUM CHLORIDE 0.9% FLUSH: 3.0000 mL | Freq: Two times a day (BID) | INTRAVENOUS | Status: DC

## 2022-06-06 MED ORDER — LACTATED RINGERS IV SOLN
500.0000 mL | Freq: Once | INTRAVENOUS | Status: AC
  Administered 2022-06-06: 500 mL via INTRAVENOUS

## 2022-06-06 MED ORDER — OXYCODONE-ACETAMINOPHEN 5-325 MG PO TABS: 1.0000 | ORAL_TABLET | ORAL | Status: DC | PRN

## 2022-06-06 MED ORDER — PHENYLEPHRINE 80 MCG/ML (10ML) SYRINGE FOR IV PUSH (FOR BLOOD PRESSURE SUPPORT): 80.0000 ug | PREFILLED_SYRINGE | INTRAVENOUS | Status: DC | PRN

## 2022-06-06 MED ORDER — SIMETHICONE 80 MG PO CHEW: 80.0000 mg | CHEWABLE_TABLET | ORAL | Status: DC | PRN

## 2022-06-06 MED ORDER — ONDANSETRON HCL 4 MG/2ML IJ SOLN
4.0000 mg | Freq: Four times a day (QID) | INTRAMUSCULAR | Status: DC | PRN
  Filled 2022-06-06: qty 2

## 2022-06-06 MED ORDER — LIDOCAINE HCL (PF) 1 % IJ SOLN
INTRAMUSCULAR | Status: DC | PRN
  Administered 2022-06-06: 10 mL via EPIDURAL
  Administered 2022-06-06: 2 mL via EPIDURAL

## 2022-06-06 MED ORDER — TRANEXAMIC ACID-NACL 1000-0.7 MG/100ML-% IV SOLN
INTRAVENOUS | Status: AC
  Administered 2022-06-06: 1000 mg
  Filled 2022-06-06: qty 100

## 2022-06-06 MED ORDER — SODIUM CHLORIDE 0.9% FLUSH: 3.0000 mL | INTRAVENOUS | Status: DC | PRN

## 2022-06-06 MED ORDER — METHYLERGONOVINE MALEATE 0.2 MG/ML IJ SOLN
INTRAMUSCULAR | Status: AC
  Administered 2022-06-06: 0.2 mg
  Filled 2022-06-06: qty 1

## 2022-06-06 MED ORDER — OXYTOCIN BOLUS FROM INFUSION
333.0000 mL | Freq: Once | INTRAVENOUS | Status: AC
  Administered 2022-06-06: 333 mL via INTRAVENOUS

## 2022-06-06 MED ORDER — OXYCODONE-ACETAMINOPHEN 5-325 MG PO TABS: 2.0000 | ORAL_TABLET | ORAL | Status: DC | PRN

## 2022-06-06 MED ORDER — FENTANYL-BUPIVACAINE-NACL 0.5-0.125-0.9 MG/250ML-% EP SOLN
EPIDURAL | Status: DC | PRN
  Administered 2022-06-06: 12 mL/h via EPIDURAL

## 2022-06-06 MED ORDER — WITCH HAZEL-GLYCERIN EX PADS: 1.0000 | MEDICATED_PAD | CUTANEOUS | Status: DC | PRN

## 2022-06-06 MED ORDER — LACTATED RINGERS IV SOLN: INTRAVENOUS | Status: DC

## 2022-06-06 MED ORDER — LIDOCAINE HCL (PF) 1 % IJ SOLN: 30.0000 mL | INTRAMUSCULAR | Status: DC | PRN

## 2022-06-06 MED ORDER — PRENATAL MULTIVITAMIN CH
1.0000 | ORAL_TABLET | Freq: Every day | ORAL | Status: DC
  Administered 2022-06-07: 1 via ORAL
  Filled 2022-06-06: qty 1

## 2022-06-06 MED ORDER — OXYTOCIN-SODIUM CHLORIDE 30-0.9 UT/500ML-% IV SOLN
1.0000 m[IU]/min | INTRAVENOUS | Status: DC
  Administered 2022-06-06: 2 m[IU]/min via INTRAVENOUS

## 2022-06-06 MED ORDER — DIBUCAINE (PERIANAL) 1 % EX OINT: 1.0000 | TOPICAL_OINTMENT | CUTANEOUS | Status: DC | PRN

## 2022-06-06 MED ORDER — SODIUM CHLORIDE 0.9 % IV SOLN: INTRAVENOUS | Status: DC | PRN

## 2022-06-06 MED ORDER — EPHEDRINE 5 MG/ML INJ: 10.0000 mg | INTRAVENOUS | Status: DC | PRN

## 2022-06-06 MED ORDER — LACTATED RINGERS IV SOLN: 500.0000 mL | INTRAVENOUS | Status: DC | PRN

## 2022-06-06 MED ORDER — SENNOSIDES-DOCUSATE SODIUM 8.6-50 MG PO TABS
2.0000 | ORAL_TABLET | ORAL | Status: DC
  Administered 2022-06-07: 2 via ORAL
  Filled 2022-06-06: qty 2

## 2022-06-06 MED ORDER — IBUPROFEN 600 MG PO TABS
600.0000 mg | ORAL_TABLET | Freq: Four times a day (QID) | ORAL | Status: DC
  Administered 2022-06-06 – 2022-06-07 (×4): 600 mg via ORAL
  Filled 2022-06-06 (×4): qty 1

## 2022-06-06 MED ORDER — TRANEXAMIC ACID-NACL 1000-0.7 MG/100ML-% IV SOLN: 1000.0000 mg | Freq: Once | INTRAVENOUS | Status: DC

## 2022-06-06 MED ORDER — TETANUS-DIPHTH-ACELL PERTUSSIS 5-2.5-18.5 LF-MCG/0.5 IM SUSY: 0.5000 mL | PREFILLED_SYRINGE | Freq: Once | INTRAMUSCULAR | Status: DC

## 2022-06-06 MED ORDER — ZOLPIDEM TARTRATE 5 MG PO TABS: 5.0000 mg | ORAL_TABLET | Freq: Every evening | ORAL | Status: DC | PRN

## 2022-06-06 MED ORDER — SOD CITRATE-CITRIC ACID 500-334 MG/5ML PO SOLN: 30.0000 mL | ORAL | Status: DC | PRN

## 2022-06-06 MED ORDER — MEASLES, MUMPS & RUBELLA VAC IJ SOLR: 0.5000 mL | Freq: Once | INTRAMUSCULAR | Status: DC

## 2022-06-06 MED ORDER — ACETAMINOPHEN 325 MG PO TABS
650.0000 mg | ORAL_TABLET | ORAL | Status: DC | PRN
  Administered 2022-06-07: 650 mg via ORAL
  Filled 2022-06-06: qty 2

## 2022-06-06 MED ORDER — OXYTOCIN-SODIUM CHLORIDE 30-0.9 UT/500ML-% IV SOLN
2.5000 [IU]/h | INTRAVENOUS | Status: DC
  Filled 2022-06-06: qty 500

## 2022-06-06 MED ORDER — ONDANSETRON HCL 4 MG/2ML IJ SOLN: 4.0000 mg | INTRAMUSCULAR | Status: DC | PRN

## 2022-06-06 MED ORDER — METHYLERGONOVINE MALEATE 0.2 MG/ML IJ SOLN: 0.2000 mg | Freq: Once | INTRAMUSCULAR | Status: DC

## 2022-06-06 MED ORDER — DIPHENHYDRAMINE HCL 50 MG/ML IJ SOLN: 12.5000 mg | INTRAMUSCULAR | Status: DC | PRN

## 2022-06-06 NOTE — Anesthesia Preprocedure Evaluation (Signed)
Anesthesia Evaluation  Patient identified by MRN, date of birth, ID band Patient awake    Reviewed: Allergy & Precautions, Patient's Chart, lab work & pertinent test results  Airway Mallampati: II  TM Distance: >3 FB Neck ROM: Full    Dental no notable dental hx.    Pulmonary neg pulmonary ROS,    Pulmonary exam normal breath sounds clear to auscultation       Cardiovascular negative cardio ROS Normal cardiovascular exam Rhythm:Regular Rate:Normal     Neuro/Psych negative neurological ROS  negative psych ROS   GI/Hepatic negative GI ROS, Neg liver ROS,   Endo/Other  negative endocrine ROS  Renal/GU negative Renal ROS  negative genitourinary   Musculoskeletal negative musculoskeletal ROS (+)   Abdominal   Peds negative pediatric ROS (+)  Hematology  (+) Blood dyscrasia, anemia , REFUSES BLOOD PRODUCTS, JEHOVAH'S WITNESSHb 11.2, plt 197   Anesthesia Other Findings   Reproductive/Obstetrics (+) Pregnancy                             Anesthesia Physical Anesthesia Plan  ASA: 2  Anesthesia Plan: Epidural   Post-op Pain Management:    Induction:   PONV Risk Score and Plan: 2  Airway Management Planned: Natural Airway  Additional Equipment: None  Intra-op Plan:   Post-operative Plan:   Informed Consent: I have reviewed the patients History and Physical, chart, labs and discussed the procedure including the risks, benefits and alternatives for the proposed anesthesia with the patient or authorized representative who has indicated his/her understanding and acceptance.       Plan Discussed with:   Anesthesia Plan Comments:         Anesthesia Quick Evaluation

## 2022-06-06 NOTE — Progress Notes (Addendum)
During attempted AROM patient requested that SVE be stopped due to discomfort and provider suggested doing pitocin 2x2. After OB team exited room, Patient asked many questions about pitocin including risks and benefits and is considering if they agree with proceeding with pitocin or if they would like to continue without augmentation.  Dr. Kennon Rounds informed of patients wishes

## 2022-06-06 NOTE — Discharge Summary (Shared)
Postpartum Discharge Summary  Date of Service updated***     Patient Name: Beverly Joseph DOB: 1986-05-08 MRN: [id number]  Date of admission: 06/05/2022 Delivery date:06/06/2022  Delivering provider: Stormy Card  Date of discharge: 06/06/2022  Admitting diagnosis: Term pregnancy [Z34.90] Intrauterine pregnancy: [redacted]w[redacted]d     Secondary diagnosis:  Principal Problem:   Term pregnancy Active Problems:   SVD (spontaneous vaginal delivery)   Anemia affecting pregnancy  Additional problems: none    Discharge diagnosis: Term Pregnancy Delivered and Anemia                                              Post partum procedures:{Postpartum procedures:23558} Augmentation: AROM and Pitocin Complications: None  Hospital course: Onset of Labor With Vaginal Delivery      36 y.o. yo G3P1011 at [redacted]w[redacted]d was admitted in Latent labor, making active cervical change on 06/05/2022. Labor course was complicated by none  Membrane Rupture Time/Date: 12:36 PM ,06/06/2022   Delivery Method:Vaginal, Spontaneous  Episiotomy: None  Lacerations:  None  Patient had a postpartum course complicated by ***.  She is ambulating, tolerating a regular diet, passing flatus, and urinating well. Patient is discharged home in stable condition on 06/06/22.  Newborn Data: Birth date:06/06/2022  Birth time:3:04 PM  Gender:Female  Living status:Living  Apgars:8 ,9  Weight:3340 g   Magnesium Sulfate received: No BMZ received: No Rhophylac:N/A MMR:N/A, Rubella Immune T-DaP:{Tdap:23962} Flu: {TRV:20233} Transfusion:{Transfusion received:30440034}  Physical exam  Vitals:   06/06/22 1531 06/06/22 1541 06/06/22 1546 06/06/22 1631  BP: (!) 88/68 108/70 (!) 112/56 97/62  Pulse: 84 90 79 72  Resp:      Temp:      TempSrc:      SpO2:      Weight:      Height:       General: {Exam; general:21111117} Lochia: {Desc; appropriate/inappropriate:30686::"appropriate"} Uterine Fundus: {Desc; firm/soft:30687} Incision:  {Exam; incision:21111123} DVT Evaluation: {Exam; dvt:2111122} Labs: Lab Results  Component Value Date   WBC 12.5 (H) 06/05/2022   HGB 11.2 (L) 06/05/2022   HCT 34.6 (L) 06/05/2022   MCV 93.0 06/05/2022   PLT 197 06/05/2022       No data to display         Edinburgh Score:     No data to display           After visit meds:  Allergies as of 06/06/2022       Reactions   Latex    Penicillins Nausea And Vomiting     Med Rec must be completed prior to using this [facility]***        Discharge home in stable condition Infant Feeding: {Baby feeding:23562} Infant Disposition:{CHL IP OB HOME WITH IDHWYS:16837} Discharge instruction: per After Visit Summary and Postpartum booklet. Activity: Advance as tolerated. Pelvic rest for 6 weeks.  Diet: {OB GBMS:11155208} Future Appointments:No future appointments. Follow up Visit: will follow up in 6 weeks with [facility]. Desires POPs for birth control.   06/06/2022 Lory Nowaczyk Sherrilyn Rist, MD

## 2022-06-06 NOTE — Anesthesia Procedure Notes (Signed)
Epidural Patient location during procedure: OB Start time: 06/06/2022 8:36 AM End time: 06/06/2022 8:45 AM  Staffing Anesthesiologist: Pervis Hocking, DO Performed: anesthesiologist   Preanesthetic Checklist Completed: patient identified, IV checked, risks and benefits discussed, monitors and equipment checked, pre-op evaluation and timeout performed  Epidural Patient position: sitting Prep: DuraPrep and site prepped and draped Patient monitoring: continuous pulse ox, blood pressure, heart rate and cardiac monitor Approach: midline Location: L3-L4 Injection technique: LOR air  Needle:  Needle type: Tuohy  Needle gauge: 17 G Needle length: 9 cm Needle insertion depth: 7 cm Catheter type: closed end flexible Catheter size: 19 Gauge Catheter at skin depth: 12 cm Test dose: negative  Assessment Sensory level: T8 Events: blood not aspirated, injection not painful, no injection resistance, no paresthesia and negative IV test  Additional Notes Patient identified. Risks/Benefits/Options discussed with patient including but not limited to bleeding, infection, nerve damage, paralysis, failed block, incomplete pain control, headache, blood pressure changes, nausea, vomiting, reactions to medication both or allergic, itching and postpartum back pain. Confirmed with bedside nurse the patient's most recent platelet count. Confirmed with patient that they are not currently taking any anticoagulation, have any bleeding history or any family history of bleeding disorders. Patient expressed understanding and wished to proceed. All questions were answered. Sterile technique was used throughout the entire procedure. Please see nursing notes for vital signs. Test dose was given through epidural catheter and negative prior to continuing to dose epidural or start infusion. Warning signs of high block given to the patient including shortness of breath, tingling/numbness in hands, complete motor  block, or any concerning symptoms with instructions to call for help. Patient was given instructions on fall risk and not to get out of bed. All questions and concerns addressed with instructions to call with any issues or inadequate analgesia.  Reason for block:procedure for pain

## 2022-06-06 NOTE — Progress Notes (Addendum)
Beverly Joseph is a 36 y.o. G3P1011 at [redacted]w[redacted]d by LMP admitted for active labor  Subjective:   Objective: BP 111/65   Pulse 79   Temp 98.7 F (37.1 C) (Oral)   Resp 16   Ht 5\' 5"  (1.651 m)   Wt 79.2 kg   LMP 09/08/2021 (Exact Date)   SpO2 98%   BMI 29.04 kg/m  No intake/output data recorded. No intake/output data recorded.  FHT:  FHR: 135 bpm, variability: moderate,  accelerations:  Present,  decelerations:  Absent UC:   irregular, every 2-5 minutes SVE:   2/50/-3 Attempted AROM, but patient too uncomfortable  Labs: Lab Results  Component Value Date   WBC 12.5 (H) 06/05/2022   HGB 11.2 (L) 06/05/2022   HCT 34.6 (L) 06/05/2022   MCV 93.0 06/05/2022   PLT 197 06/05/2022    Assessment / Plan: Protracted latent phase  Labor:  start pitocin and AROM when making change Preeclampsia:  no signs or symptoms of toxicity Fetal Wellbeing:  Category I Pain Control:  IV pain meds I/D:  GBS negative Anticipated MOD:  NSVD  Donnamae Jude, MD 06/06/2022, 1:18 AM

## 2022-06-07 DIAGNOSIS — Z3A38 38 weeks gestation of pregnancy: Secondary | ICD-10-CM | POA: Diagnosis not present

## 2022-06-07 MED ORDER — ACETAMINOPHEN 325 MG PO TABS: 650.0000 mg | ORAL_TABLET | ORAL | 0 refills | Status: DC | PRN

## 2022-06-07 MED ORDER — IBUPROFEN 600 MG PO TABS: 600.0000 mg | ORAL_TABLET | Freq: Four times a day (QID) | ORAL | 0 refills | Status: DC

## 2022-06-07 NOTE — Anesthesia Postprocedure Evaluation (Signed)
Anesthesia Post Note  Patient: Beverly Joseph  Procedure(s) Performed: AN AD [place]     Patient location during evaluation: Mother Baby Anesthesia Type: Epidural Level of consciousness: awake and alert Pain management: pain level controlled Vital Signs Assessment: post-procedure vital signs reviewed and stable Respiratory status: spontaneous breathing, nonlabored ventilation and respiratory function stable Cardiovascular status: stable Postop Assessment: no headache, no backache and epidural receding Anesthetic complications: no   No notable events documented.  Last Vitals:  Vitals:   06/06/22 2205 06/07/22 0600  BP: (!) 108/58 94/63  Pulse: 78 68  Resp: 17 18  Temp: 37.5 C 37.2 C  SpO2:      Last Pain:  Vitals:   06/07/22 0955  TempSrc:   PainSc: 0-No pain   Pain Goal:                Epidural/Spinal Function Cutaneous sensation: Normal sensation (06/07/22 0955), Patient able to flex knees: Yes (06/07/22 0955), Patient able to lift hips off bed: Yes (06/07/22 0955), Back pain beyond tenderness at insertion site: No (06/07/22 0955), Progressively worsening motor and/or sensory loss: No (06/07/22 0955), Bowel and/or bladder incontinence post epidural: No (06/07/22 0955)  Nonah Mattes N

## 2022-06-07 NOTE — Lactation Note (Signed)
This note was copied from a baby's chart. Lactation Consultation Note  Patient Name: Beverly Joseph IDCVU'D Date: 06/07/2022 Reason for consult: Initial assessment;Early term 37-38.6wks Age:35 hours Assisted in waking baby for BF. Baby latched. No swallows heard at this time. BF causing a lot of abdominal cramping. Will report to RN. Mom stated baby has BF 3 times really good. This makes 4 times. Baby is sleepy needing a lot of stimulation to wake for feedings. Newborn feeding habits, behavior, STS, I&O reviewed. Mom encouraged to feed baby 8-12 times/24 hours and with feeding cues.   Encouraged mom to call for assistance as needed. Encouraged to un-swaddle and open outfit to be STS for feedings.  Maternal Data Does the patient have breastfeeding experience prior to this delivery?: Yes How long did the patient breastfeed?: 10 months  Feeding    LATCH Score Latch: Grasps breast easily, tongue down, lips flanged, rhythmical sucking.  Audible Swallowing: A few with stimulation  Type of Nipple: Everted at rest and after stimulation (short shaft)  Comfort (Breast/Nipple): Soft / non-tender  Hold (Positioning): Assistance needed to correctly position infant at breast and maintain latch.  LATCH Score: 8   Lactation Tools Discussed/Used    Interventions Interventions: Breast feeding basics reviewed;Adjust position;Assisted with latch;Support pillows;Skin to skin;Position options;Breast massage;Hand express;Breast compression  Discharge    Consult Status Consult Status: Follow-up Date: 06/07/22 Follow-up type: In-patient    [name], Elta Gu[place] 06/07/2022, 12:09 AM

## 2022-06-07 NOTE — Progress Notes (Signed)
POSTPARTUM PROGRESS NOTE  Post Partum Day 1  Subjective:  Beverly Joseph is a 36 y.o. G3P1011 s/p SVD at [redacted]w[redacted]d.  She reports she is doing well. No acute events overnight. She denies any problems with ambulating, voiding or po intake. Denies nausea or vomiting.  Pain is well controlled.  Lochia is minimal.  Objective: Blood pressure 94/63, pulse 68, temperature 99 F (37.2 C), temperature source Oral, resp. rate 18, height 5\' 5"  (1.651 m), weight 79.2 kg, last menstrual period 09/08/2021, SpO2 99 %.  Physical Exam:  General: alert, cooperative and no distress Chest: no respiratory distress Heart:regular rate, distal pulses intact Abdomen: soft, nontender,  Uterine Fundus: firm, appropriately tender DVT Evaluation: No calf swelling or tenderness Extremities: no edema Skin: warm, dry  Recent Labs    06/05/22 2219  HGB 11.2*  HCT 34.6*    Assessment/Plan: Beverly Joseph is a 36 y.o. G3P1011 s/p SVD at [redacted]w[redacted]d   PPD#1 - Doing well  Routine postpartum care  Contraception: POPs Feeding: breast Dispo: Plan for discharge today or tomorrow, pending baby discharge.   LOS: 2 days   Liliane Channel MD MPH OB Fellow, [place] for [place] [place] 06/07/2022

## 2022-06-07 NOTE — Social Work (Signed)
MOB was referred for history of depression/anxiety.  * Referral screened out by Clinical Social Worker because none of the following criteria appear to apply:  ~ History of anxiety/depression during this pregnancy, or of post-partum depression following prior delivery.  ~ Diagnosis of anxiety and/or depression within last 3 years OR * MOB's symptoms currently being treated with medication and/or therapy.  Per chart review no anxiety or depression noted, MOB EPDS Score 3  Please contact the Clinical Social Worker if needs arise or by MOB request.  Letta Kocher, [place] Social Worker [phone]

## 2022-06-16 ENCOUNTER — Telehealth (HOSPITAL_COMMUNITY): Payer: Self-pay | Admitting: *Deleted

## 2022-06-16 NOTE — Telephone Encounter (Signed)
Mom reports feeling good. No concerns about herself at this time. EPDS=2 [facility] score=4) Mom reports baby is doing well. Feeding, peeing, and pooping without difficulty. Safe sleep reviewed. Mom reports no concerns about baby at present.  Odis Hollingshead, RN 06-16-2022 at 11:44am

## 2022-06-28 DIAGNOSIS — Z419 Encounter for procedure for purposes other than remedying health state, unspecified: Secondary | ICD-10-CM | POA: Diagnosis not present

## 2022-07-26 DIAGNOSIS — R824 Acetonuria: Secondary | ICD-10-CM | POA: Diagnosis not present

## 2022-07-26 DIAGNOSIS — F32A Depression, unspecified: Secondary | ICD-10-CM | POA: Diagnosis not present

## 2022-07-26 DIAGNOSIS — Z862 Personal history of diseases of the blood and blood-forming organs and certain disorders involving the immune mechanism: Secondary | ICD-10-CM | POA: Diagnosis not present

## 2022-07-26 DIAGNOSIS — Z113 Encounter for screening for infections with a predominantly sexual mode of transmission: Secondary | ICD-10-CM | POA: Diagnosis not present

## 2022-07-26 DIAGNOSIS — K5900 Constipation, unspecified: Secondary | ICD-10-CM | POA: Diagnosis not present

## 2022-07-26 DIAGNOSIS — F419 Anxiety disorder, unspecified: Secondary | ICD-10-CM | POA: Diagnosis not present

## 2022-07-26 DIAGNOSIS — R5383 Other fatigue: Secondary | ICD-10-CM | POA: Diagnosis not present

## 2022-07-26 DIAGNOSIS — K648 Other hemorrhoids: Secondary | ICD-10-CM | POA: Diagnosis not present

## 2022-07-26 DIAGNOSIS — M67442 Ganglion, left hand: Secondary | ICD-10-CM | POA: Diagnosis not present

## 2022-07-28 DIAGNOSIS — Z419 Encounter for procedure for purposes other than remedying health state, unspecified: Secondary | ICD-10-CM | POA: Diagnosis not present

## 2022-08-07 ENCOUNTER — Encounter: Payer: Self-pay | Admitting: *Deleted

## 2022-08-28 DIAGNOSIS — Z419 Encounter for procedure for purposes other than remedying health state, unspecified: Secondary | ICD-10-CM | POA: Diagnosis not present

## 2022-09-28 DIAGNOSIS — Z419 Encounter for procedure for purposes other than remedying health state, unspecified: Secondary | ICD-10-CM | POA: Diagnosis not present

## 2022-10-17 DIAGNOSIS — B349 Viral infection, unspecified: Secondary | ICD-10-CM | POA: Diagnosis not present

## 2022-10-17 DIAGNOSIS — L259 Unspecified contact dermatitis, unspecified cause: Secondary | ICD-10-CM | POA: Diagnosis not present

## 2022-10-27 DIAGNOSIS — Z419 Encounter for procedure for purposes other than remedying health state, unspecified: Secondary | ICD-10-CM | POA: Diagnosis not present

## 2022-11-27 DIAGNOSIS — Z419 Encounter for procedure for purposes other than remedying health state, unspecified: Secondary | ICD-10-CM | POA: Diagnosis not present

## 2022-12-27 DIAGNOSIS — Z419 Encounter for procedure for purposes other than remedying health state, unspecified: Secondary | ICD-10-CM | POA: Diagnosis not present

## 2023-01-27 DIAGNOSIS — Z419 Encounter for procedure for purposes other than remedying health state, unspecified: Secondary | ICD-10-CM | POA: Diagnosis not present

## 2023-02-26 DIAGNOSIS — Z419 Encounter for procedure for purposes other than remedying health state, unspecified: Secondary | ICD-10-CM | POA: Diagnosis not present

## 2023-03-29 DIAGNOSIS — Z419 Encounter for procedure for purposes other than remedying health state, unspecified: Secondary | ICD-10-CM | POA: Diagnosis not present

## 2023-04-29 DIAGNOSIS — Z419 Encounter for procedure for purposes other than remedying health state, unspecified: Secondary | ICD-10-CM | POA: Diagnosis not present

## 2023-05-29 DIAGNOSIS — Z419 Encounter for procedure for purposes other than remedying health state, unspecified: Secondary | ICD-10-CM | POA: Diagnosis not present

## 2023-06-29 DIAGNOSIS — Z419 Encounter for procedure for purposes other than remedying health state, unspecified: Secondary | ICD-10-CM | POA: Diagnosis not present

## 2023-07-29 DIAGNOSIS — Z419 Encounter for procedure for purposes other than remedying health state, unspecified: Secondary | ICD-10-CM | POA: Diagnosis not present

## 2023-08-29 DIAGNOSIS — Z419 Encounter for procedure for purposes other than remedying health state, unspecified: Secondary | ICD-10-CM | POA: Diagnosis not present

## 2023-09-29 DIAGNOSIS — Z419 Encounter for procedure for purposes other than remedying health state, unspecified: Secondary | ICD-10-CM | POA: Diagnosis not present

## 2023-10-27 DIAGNOSIS — Z419 Encounter for procedure for purposes other than remedying health state, unspecified: Secondary | ICD-10-CM | POA: Diagnosis not present

## 2023-12-08 DIAGNOSIS — Z419 Encounter for procedure for purposes other than remedying health state, unspecified: Secondary | ICD-10-CM | POA: Diagnosis not present

## 2023-12-10 ENCOUNTER — Telehealth: Payer: Self-pay

## 2023-12-10 DIAGNOSIS — Z0000 Encounter for general adult medical examination without abnormal findings: Secondary | ICD-10-CM

## 2023-12-13 ENCOUNTER — Telehealth: Payer: Self-pay

## 2023-12-13 NOTE — Progress Notes (Signed)
 Thank you for this referral. The [facility] team receives referrals for eligible patients: all patients with [facility] Primary Care Provider (any health plan including Medicaid or uninsured) and patients with a [facility] Primary Care Provider AND contracted health plan.     This patient does not have a [facility] or [facility] Primary Care Provider. [facility] cannot directly provide Care Management services to patients who do not have a qualifying Primary Care Provider. Please call us if you need further clarification at [phone].     [facility]  [facility], [facility] Health Care Management Assistant Direct Dial: [phone]  Fax: [phone]

## 2024-01-06 IMAGING — US US PELVIS COMPLETE WITH TRANSVAGINAL
1 series · 13 of 25 positions shown · non-contrast
Comparison: None

CLINICAL DATA: Dysmenorrhea, LMP 09/08/2021

EXAM:
TRANSABDOMINAL AND TRANSVAGINAL ULTRASOUND OF PELVIS
TECHNIQUE: Both transabdominal and transvaginal ultrasound examinations of the
pelvis were performed. Transabdominal technique was performed for
global imaging of the pelvis including uterus, ovaries, adnexal
regions, and pelvic cul-de-sac. It was necessary to proceed with
endovaginal exam following the transabdominal exam to visualize the
lower uterine segment and endometrium.

[Series 1: us pelvic complete with transvaginal · 13 of 110 slices shown]
[im 1/110]
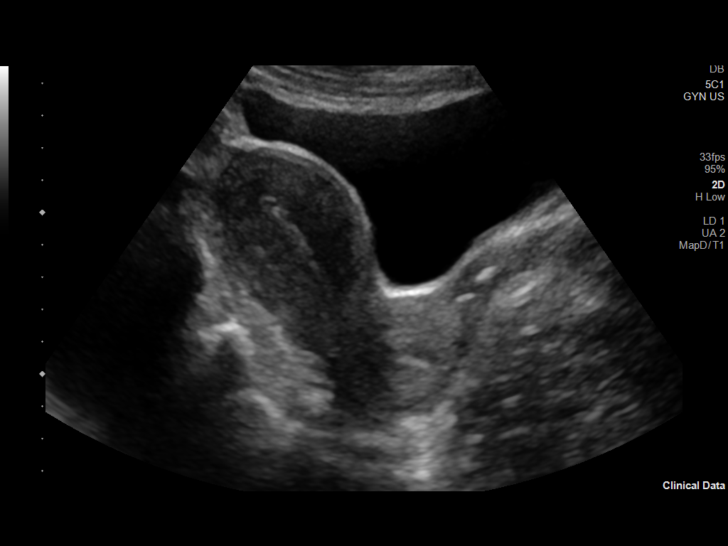
[im 10/110]
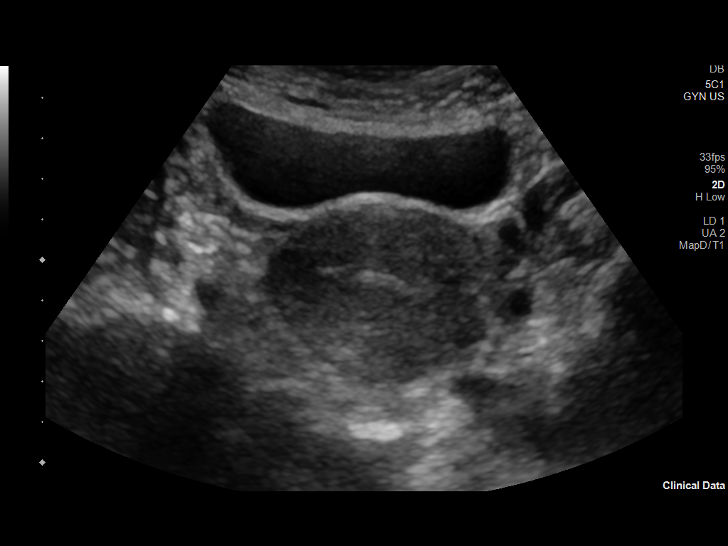
[im 19/110]
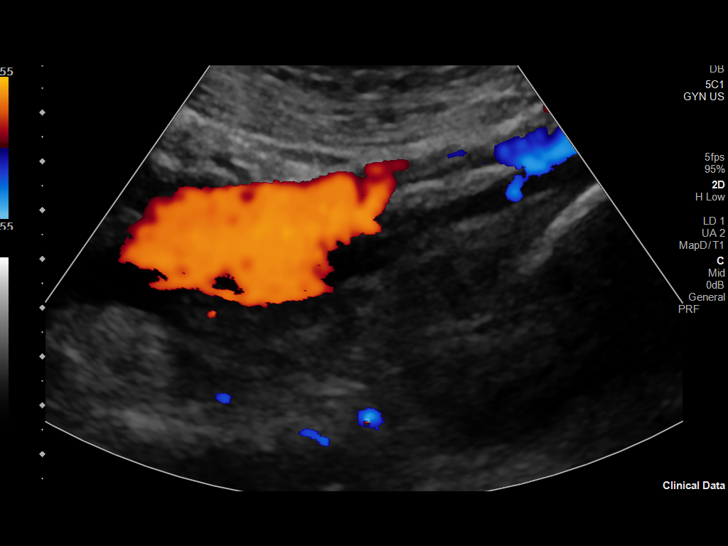
[im 28/110]
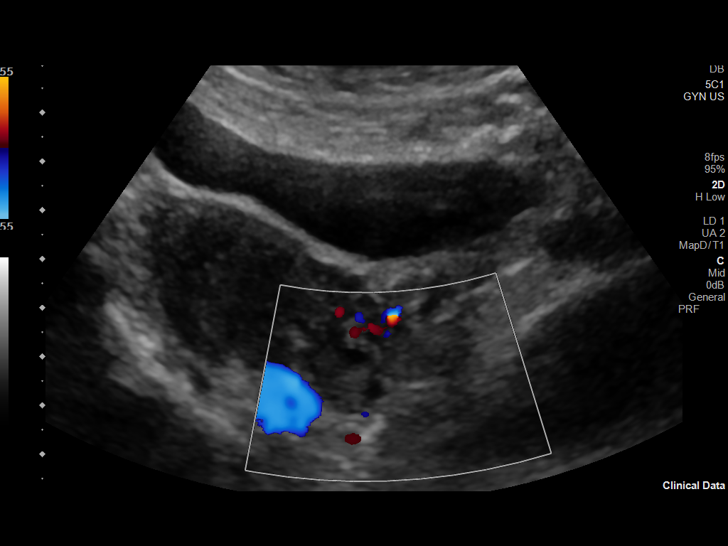
[im 37/110]
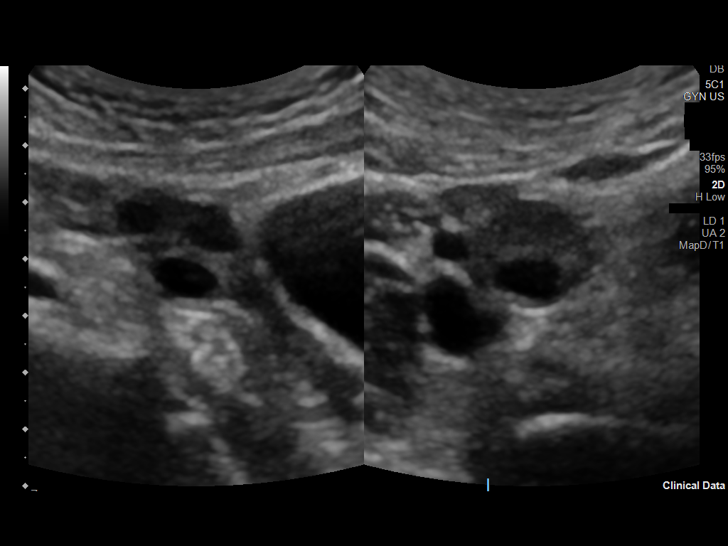
[im 46/110]
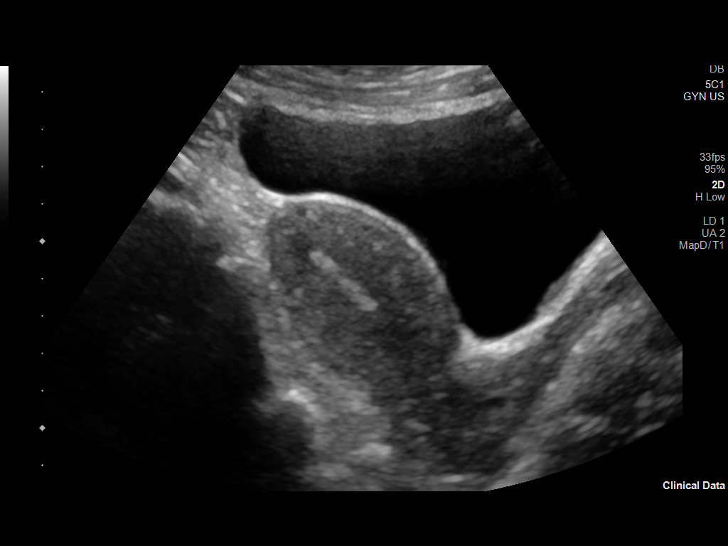
[im 55/110]
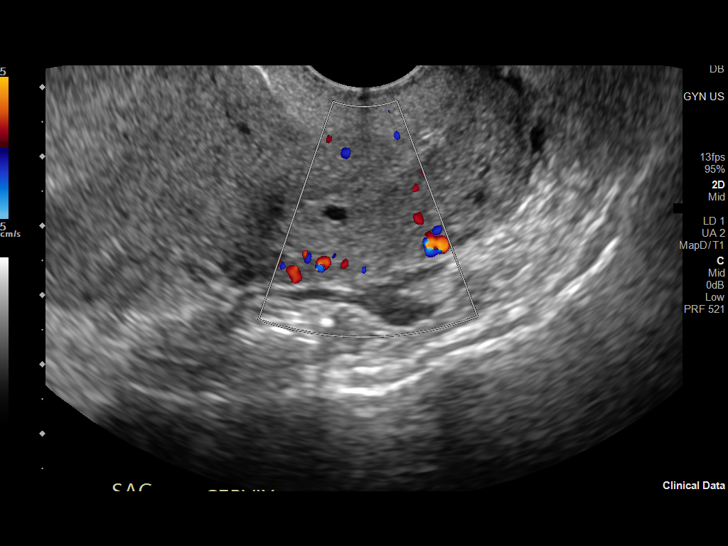
[im 64/110]
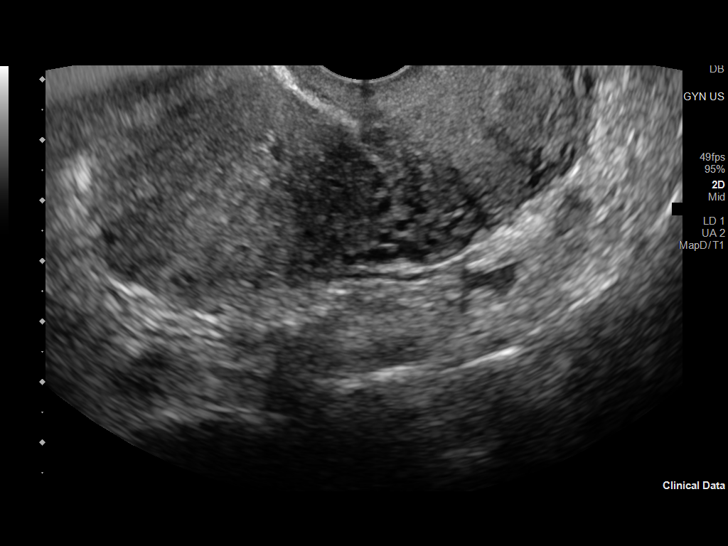
[im 73/110]
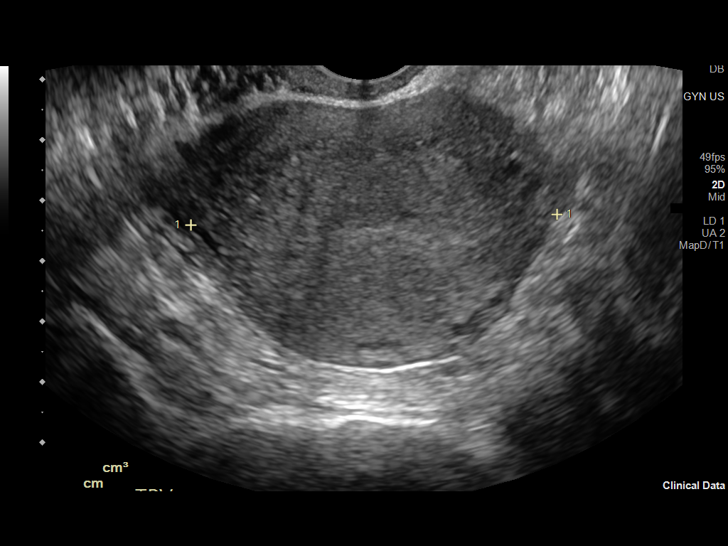
[im 82/110]
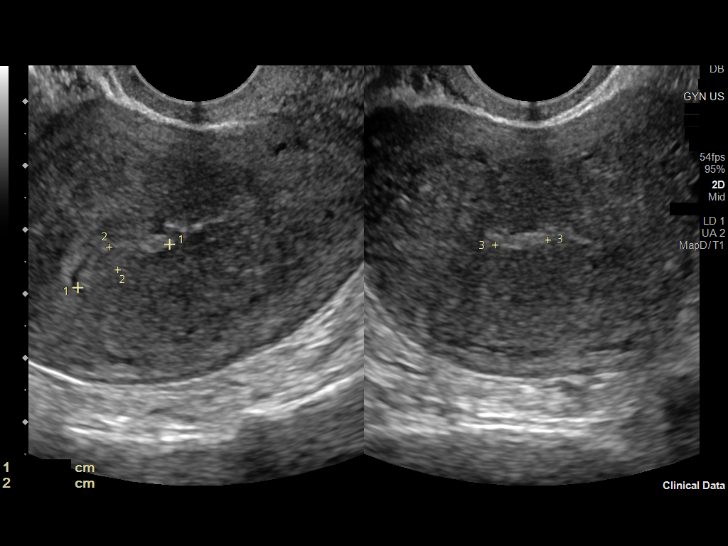
[im 91/110]
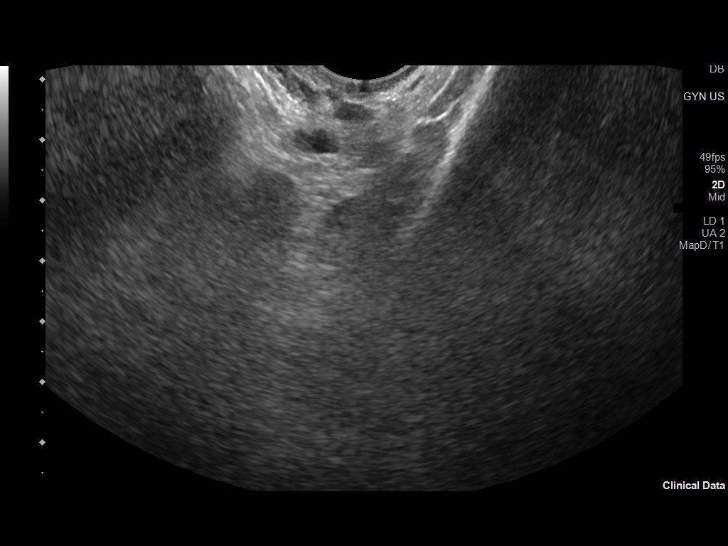
[im 100/110]
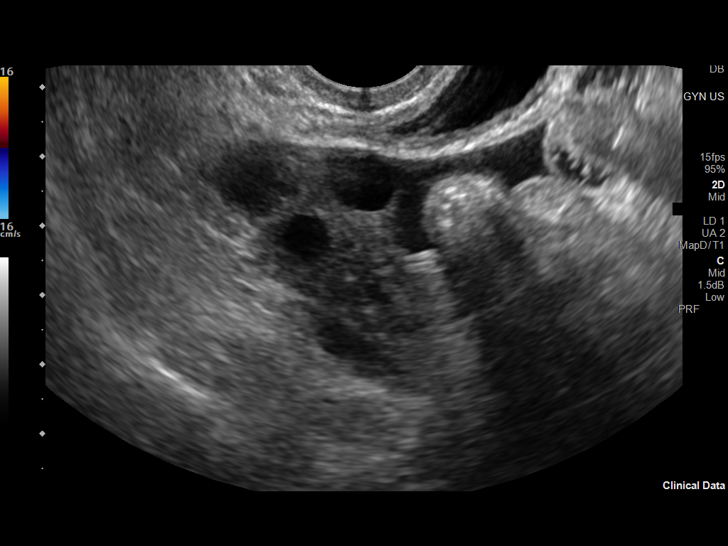
[im 110/110]
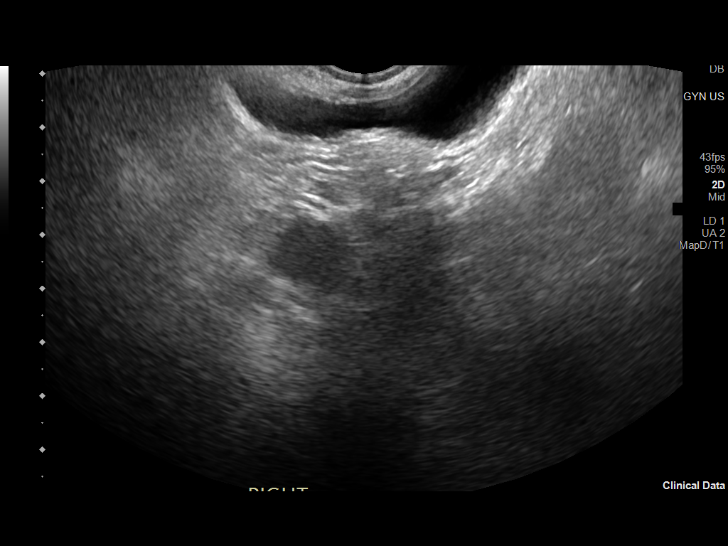

[13 of 25 positions shown; findings below may reference images not displayed]

FINDINGS: Uterus

Measurements: 8.7 x 4.7 x 6.1 cm = volume: 130 mL. Anteverted.
Normal morphology without mass. Tiny nabothian cysts at cervix.

Endometrium

Thickness: 4 mm. Questionable endometrial polyp 8 x 4 x 16 mm. Trace
endometrial fluid.

Right ovary

Measurements: 4.5 x 2.1 x 2.9 cm = volume: 14.7 mL. Normal
morphology without mass

Left ovary

Measurements: 2.7 x 1.2 x 1.4 cm = volume: 2.4 mL. Normal morphology
without mass

Other findings

Trace nonspecific free pelvic fluid.  No adnexal masses.
IMPRESSION: Questionable endometrial polyp 8 x 4 x 16 mm; consider
sonohysterogram for further evaluation, prior to hysteroscopy or
endometrial biopsy.

Remainder of exam unremarkable.

## 2024-01-07 DIAGNOSIS — Z419 Encounter for procedure for purposes other than remedying health state, unspecified: Secondary | ICD-10-CM | POA: Diagnosis not present

## 2024-02-07 DIAGNOSIS — Z419 Encounter for procedure for purposes other than remedying health state, unspecified: Secondary | ICD-10-CM | POA: Diagnosis not present

## 2024-03-08 DIAGNOSIS — Z419 Encounter for procedure for purposes other than remedying health state, unspecified: Secondary | ICD-10-CM | POA: Diagnosis not present

## 2024-04-08 DIAGNOSIS — Z419 Encounter for procedure for purposes other than remedying health state, unspecified: Secondary | ICD-10-CM | POA: Diagnosis not present

## 2024-04-21 ENCOUNTER — Ambulatory Visit (INDEPENDENT_AMBULATORY_CARE_PROVIDER_SITE_OTHER): Admitting: Family Medicine

## 2024-04-21 VITALS — BP 102/64 | HR 83 | Temp 98.2°F | Ht 65.0 in | Wt 153.2 lb

## 2024-04-21 DIAGNOSIS — F419 Anxiety disorder, unspecified: Secondary | ICD-10-CM | POA: Diagnosis not present

## 2024-04-21 DIAGNOSIS — F32A Depression, unspecified: Secondary | ICD-10-CM

## 2024-04-21 DIAGNOSIS — Z209 Contact with and (suspected) exposure to unspecified communicable disease: Secondary | ICD-10-CM

## 2024-04-21 DIAGNOSIS — Z7689 Persons encountering health services in other specified circumstances: Secondary | ICD-10-CM | POA: Diagnosis not present

## 2024-04-21 DIAGNOSIS — D508 Other iron deficiency anemias: Secondary | ICD-10-CM

## 2024-04-21 MED ORDER — SERTRALINE HCL 25 MG PO TABS: 25.0000 mg | ORAL_TABLET | Freq: Every day | ORAL | 1 refills | Status: DC

## 2024-04-21 NOTE — Progress Notes (Signed)
 Patient Office Visit  Assessment & Plan:  Other iron  deficiency anemia -     CBC with Differential/Platelet -     Comprehensive metabolic panel with GFR -     Lipid panel -     Iron , TIBC and Ferritin Panel  Encounter to establish care -     TSH  Exposure to communicable disease -     C. trachomatis/N. gonorrhoeae RNA -     Hepatitis B surface antigen -     Hepatitis C antibody -     HIV Antibody (routine testing w rflx) -     RPR -     HSV 2 antibody, IgG  Anxiety and depression -     Sertraline  HCl; Take 1 tablet (25 mg total) by mouth daily.  Dispense: 90 tablet; Refill: 1 -     Ambulatory referral to Psychology   Assessment and Plan    Fatigue Chronic fatigue likely due to iron  deficiency and stress. Iron  deficiency anemia treated with iron  infusions during pregnancy. Currently on vitamin B complex with uncertain benefit. - Order comprehensive blood work including iron  levels, thyroid  function, and routine labs.  Anxiety and depressive symptoms Stress and anxiety related to personal and family circumstances. Interested in medication but concerned about side effects and dependency. Previous therapy attempts were unsuccessful. - Prescribe Zoloft  at low dose, daily in the morning. - Refer to psychotherapy services in [place]. - Follow up in 4-6 weeks to assess medication response.  Menstrual cycle changes Experiencing 21-day cycles post-breastfeeding. History of ovarian cysts and uterine polyp. No hormonal birth control due to side effects.  Screening for sexually transmitted infections Concerned about STIs due to husband's infidelity. History of gonorrhea in 2017, treated successfully. - Order STI screening including HIV and other common infections.     Recommend healthy diet i.e mediterranean/DASH diet, consistent exercise - 30 minutes 5 day per week, and gradual weight loss. Psychotherapy consult ordered  Start Zoloft  25mg  once per day. RTC 4-6 weeks or sooner  if nec.      Return in about 4 weeks (around 05/19/2024), or if symptoms worsen or fail to improve, for depression.   Subjective:    Patient ID: Beverly Joseph, female    DOB: September 24, 1985  Age: 38 y.o. MRN: [id number]  Chief Complaint  Patient presents with   Medical Management of Chronic Issues   Establish Care    HPI Discussed the use of AI scribe software for clinical note transcription with the patient, who gave verbal consent to proceed.  History of Present Illness        Beverly Joseph is a 38 year old female who presents with concerns about sexually transmitted infections, stress/anxiety and establish medical care here.   She is concerned about sexually transmitted infections due to her husband's infidelity. She has no current symptoms such as discharge but has a history of gonorrhea in 2017, which was treated successfully. She is not using birth control due to hormonal side effects and is using natural methods instead. Her menstrual cycle has changed to a 21-day cycle since stopping breastfeeding in November or December.  She has a history of iron  deficiency anemia during her pregnancy, for which she received three iron  infusions. She experienced significant fatigue during pregnancy, which improved with treatment. She is currently taking a vitamin B complex due to fatigue and low energy, but she is unsure of its effectiveness.  Her past medical history includes ovarian cysts, which caused severe cramps and were  treated with an unknown medication. An ultrasound in 2023 revealed a polyp but no ovarian cysts.  She denies smoking, drug use, and alcohol consumption. She reports stress and decreased appetite, possibly due to her current life circumstances, including caring for her children, her son who has autism and her husband's unemployment and depression. Patient did try counseling before but did not think it was a good match.  Patient is originally from [place] but lives in [place] before moving to [place] .  Physical Exam Results  RADIOLOGY Ultrasound: Uterine polyp (2023) Assessment & Plan Fatigue Chronic fatigue likely due to iron  deficiency and stress. Iron  deficiency anemia treated with iron  infusions during pregnancy. Currently on vitamin B complex with uncertain benefit. - Order comprehensive blood work including iron  levels, thyroid  function, and routine labs.  Anxiety and depressive symptoms Stress and anxiety related to personal and family circumstances. Interested in medication but concerned about side effects and dependency. Previous therapy attempts were unsuccessful. - Prescribe Zoloft  at low dose, daily in the morning. - Refer to psychotherapy services in [place]. - Follow up in 4-6 weeks to assess medication response.  Menstrual cycle changes Experiencing 21-day cycles post-breastfeeding. History of ovarian cysts and uterine polyp. No hormonal birth control due to side effects.  Screening for sexually transmitted infections Concerned about STIs due to husband's infidelity. History of gonorrhea in 2017, treated successfully. - Order STI screening including HIV and other common infections.    The ASCVD Risk score (Arnett DK, et al., 2019) failed to calculate for the following reasons:   The 2019 ASCVD risk score is only valid for ages 83 to 48  Past Medical History:  Diagnosis Date   Allergy More than a decade   Anemia    Anxiety Been a while   Arthritis Lately   Depression Been couple years now   GERD (gastroesophageal reflux disease) Been a while   Hemorrhoids 2016   Past Surgical History:  Procedure Laterality Date   TONSILLECTOMY     WISDOM TOOTH EXTRACTION     Social History   Tobacco Use   Smoking status: Never   Smokeless tobacco: Never  Vaping Use   Vaping status: Never Used  Substance Use Topics   Alcohol use: Never   Drug use: Never   Family History  Problem Relation Age of Onset   Depression Mother     Miscarriages / Stillbirths Mother    Hypertension Father    Asthma Sister    Varicose Veins Sister    Miscarriages / [place] Sister    ADD / ADHD Son    Learning disabilities Son    Thyroid  cancer Neg Hx    Allergies  Allergen Reactions   Latex    Penicillins Nausea And Vomiting    ROS    Objective:    BP 102/64   Pulse 83   Temp 98.2 F (36.8 C)   Ht 5' 5 (1.651 m)   Wt 153 lb 4 oz (69.5 kg)   LMP 04/12/2024 (Exact Date)   SpO2 99%   Breastfeeding No   BMI 25.50 kg/m  BP Readings from Last 3 Encounters:  04/21/24 102/64  06/07/22 96/63  05/12/22 (!) 98/58   Wt Readings from Last 3 Encounters:  04/21/24 153 lb 4 oz (69.5 kg)  06/05/22 174 lb 8 oz (79.2 kg)  05/12/22 166 lb 8 oz (75.5 kg)    Physical Exam Vitals and nursing note reviewed.  Constitutional:  Appearance: Normal appearance.  HENT:     Head: Normocephalic.     Right Ear: Tympanic membrane, ear canal and external ear normal.     Left Ear: Tympanic membrane, ear canal and external ear normal.  Eyes:     Extraocular Movements: Extraocular movements intact.     Conjunctiva/sclera: Conjunctivae normal.     Pupils: Pupils are equal, round, and reactive to light.  Cardiovascular:     Rate and Rhythm: Normal rate and regular rhythm.     Heart sounds: Normal heart sounds.  Pulmonary:     Effort: Pulmonary effort is normal.     Breath sounds: Normal breath sounds.  Musculoskeletal:     Right lower leg: No edema.     Left lower leg: No edema.  Neurological:     General: No focal deficit present.     Mental Status: She is alert and oriented to person, place, and time.  Psychiatric:        Mood and Affect: Mood normal.        Behavior: Behavior normal.        Thought Content: Thought content normal.        Judgment: Judgment normal.      No results found for any visits on 04/21/24.

## 2024-04-22 LAB — C. TRACHOMATIS/N. GONORRHOEAE RNA
C. trachomatis RNA, TMA: NOT DETECTED
N. gonorrhoeae RNA, TMA: NOT DETECTED

## 2024-04-23 ENCOUNTER — Ambulatory Visit: Payer: Self-pay | Admitting: Family Medicine

## 2024-04-23 LAB — CBC WITH DIFFERENTIAL/PLATELET
Absolute Lymphocytes: 2610 {cells}/uL (ref 850–3900)
Absolute Monocytes: 544 {cells}/uL (ref 200–950)
Basophils Absolute: 34 {cells}/uL (ref 0–200)
Basophils Relative: 0.4 %
Eosinophils Absolute: 68 {cells}/uL (ref 15–500)
Eosinophils Relative: 0.8 %
HCT: 35.6 % (ref 35.0–45.0)
Hemoglobin: 11 g/dL — ABNORMAL LOW (ref 11.7–15.5)
MCH: 27.4 pg (ref 27.0–33.0)
MCHC: 30.9 g/dL — ABNORMAL LOW (ref 32.0–36.0)
MCV: 88.6 fL (ref 80.0–100.0)
MPV: 10.6 fL (ref 7.5–12.5)
Monocytes Relative: 6.4 %
Neutro Abs: 5245 {cells}/uL (ref 1500–7800)
Neutrophils Relative %: 61.7 %
Platelets: 276 Thousand/uL (ref 140–400)
RBC: 4.02 Million/uL (ref 3.80–5.10)
RDW: 12.9 % (ref 11.0–15.0)
Total Lymphocyte: 30.7 %
WBC: 8.5 Thousand/uL (ref 3.8–10.8)

## 2024-04-23 LAB — TSH: TSH: 1.01 m[IU]/L

## 2024-04-23 LAB — COMPREHENSIVE METABOLIC PANEL WITH GFR
AG Ratio: 1.5 (calc) (ref 1.0–2.5)
ALT: 10 U/L (ref 6–29)
AST: 14 U/L (ref 10–30)
Albumin: 4.6 g/dL (ref 3.6–5.1)
Alkaline phosphatase (APISO): 60 U/L (ref 31–125)
BUN: 12 mg/dL (ref 7–25)
CO2: 27 mmol/L (ref 20–32)
Calcium: 9.3 mg/dL (ref 8.6–10.2)
Chloride: 103 mmol/L (ref 98–110)
Creat: 0.68 mg/dL (ref 0.50–0.97)
Globulin: 3.1 g/dL (ref 1.9–3.7)
Glucose, Bld: 64 mg/dL — ABNORMAL LOW (ref 65–99)
Potassium: 4.4 mmol/L (ref 3.5–5.3)
Sodium: 139 mmol/L (ref 135–146)
Total Bilirubin: 0.4 mg/dL (ref 0.2–1.2)
Total Protein: 7.7 g/dL (ref 6.1–8.1)
eGFR: 116 mL/min/1.73m2 (ref >=60)

## 2024-04-23 LAB — LIPID PANEL
Cholesterol: 156 mg/dL (ref <200)
HDL: 58 mg/dL (ref >=50)
LDL Cholesterol (Calc): 85 mg/dL
Non-HDL Cholesterol (Calc): 98 mg/dL (ref <130)
Total CHOL/HDL Ratio: 2.7 (calc) (ref <5.0)
Triglycerides: 44 mg/dL (ref <150)

## 2024-04-23 LAB — RPR: RPR Ser Ql: NONREACTIVE

## 2024-04-23 LAB — HEPATITIS B SURFACE ANTIGEN: Hepatitis B Surface Ag: NONREACTIVE

## 2024-04-23 LAB — IRON,TIBC AND FERRITIN PANEL
%SAT: 14 % — ABNORMAL LOW (ref 16–45)
Ferritin: 11 ng/mL — ABNORMAL LOW (ref 16–154)
Iron: 54 ng/mL — AB (ref 40–190)
TIBC: 389 ug/dL (ref 250–450)

## 2024-04-23 LAB — HIV ANTIBODY (ROUTINE TESTING W REFLEX): HIV 1&2 Ab, 4th Generation: NONREACTIVE

## 2024-04-23 LAB — HEPATITIS C ANTIBODY: Hepatitis C Ab: NONREACTIVE

## 2024-04-23 LAB — HSV 2 ANTIBODY, IGG: HSV 2 Glycoprotein G Ab, IgG: <0.9 {index}

## 2024-05-05 ENCOUNTER — Ambulatory Visit: Admitting: Family Medicine

## 2024-05-09 DIAGNOSIS — Z419 Encounter for procedure for purposes other than remedying health state, unspecified: Secondary | ICD-10-CM | POA: Diagnosis not present

## 2024-05-27 ENCOUNTER — Ambulatory Visit: Admitting: Family Medicine

## 2024-05-27 ENCOUNTER — Telehealth: Payer: Self-pay

## 2024-05-27 ENCOUNTER — Encounter: Payer: Self-pay | Admitting: Family Medicine

## 2024-05-27 VITALS — BP 112/62 | HR 85 | Temp 98.5°F | Ht 65.0 in | Wt 151.0 lb

## 2024-05-27 DIAGNOSIS — Z599 Problem related to housing and economic circumstances, unspecified: Secondary | ICD-10-CM

## 2024-05-27 DIAGNOSIS — H539 Unspecified visual disturbance: Secondary | ICD-10-CM | POA: Diagnosis not present

## 2024-05-27 DIAGNOSIS — F4323 Adjustment disorder with mixed anxiety and depressed mood: Secondary | ICD-10-CM

## 2024-05-27 DIAGNOSIS — D508 Other iron deficiency anemias: Secondary | ICD-10-CM | POA: Diagnosis not present

## 2024-05-27 MED ORDER — FERROUS SULFATE 325 (65 FE) MG PO TABS: 325.0000 mg | ORAL_TABLET | Freq: Every day | ORAL | 3 refills | Status: AC

## 2024-05-27 NOTE — Progress Notes (Signed)
 Complex Care Management Note  Care Guide Note 05/27/2024 Name: [name] MRN: [id number] DOB: June 06, 1986  Beverly Joseph is a 38 y.o. year old female who sees Aletha Bene, MD for primary care. I reached out to [facility] by phone today to offer complex care management services.  [name] was given information about Complex Care Management services today including:   The Complex Care Management services include support from the care team which includes your Nurse Care Manager, Clinical Social Worker, or Pharmacist.  The Complex Care Management team is here to help remove barriers to the health concerns and goals most important to you. Complex Care Management services are voluntary, and the patient may decline or stop services at any time by request to their care team member.   Complex Care Management Consent Status: Patient agreed to services and verbal consent obtained.   Follow up plan:  Telephone appointment with complex care management team member scheduled for:  06/02/24 and 06/17/24  Encounter Outcome:  Patient Scheduled  Leotis Rase [facility], [facility] Guide  Direct Dial: [phone]  Fax [phone]
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
## 2024-05-28 ENCOUNTER — Encounter: Payer: Self-pay | Admitting: Licensed Clinical Social Worker
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
## 2024-05-29 ENCOUNTER — Encounter: Payer: Self-pay | Admitting: Family Medicine

## 2024-06-02 ENCOUNTER — Other Ambulatory Visit: Payer: Self-pay | Admitting: Licensed Clinical Social Worker
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
## 2024-06-02 NOTE — Patient Instructions (Signed)
 Visit Information  Thank you for taking time to visit with me today. Please don't hesitate to contact me if I can be of assistance to you before our next scheduled appointment.  Our next appointment is by telephone on 06/16/2024 at 2:00 pm Please call the care guide team at [phone] if you need to cancel or reschedule your appointment.   Following is a copy of your care plan:   Goals Addressed             This Visit's Progress    BSW VBCI Social Work Care Plan       Problems:   Food Insecurity  and LCSW referral   CSW Clinical Goal(s):   Over the next 2 weeks the Patient will will follow up with Food resources and utility resources as directed by Social Work.  Interventions:  Will email resources for food pantry and utility   Patient Goals/Self-Care Activities:  Meet with LCSW on 1010/2025  Plan:   Telephone follow up appointment with care management team member scheduled for:  06/16/2024 at 2:00 pm        Please call the Suicide and Crisis Lifeline: 988 go to [facility] [address] [place] [phone]) call 911 if you are experiencing a Mental Health or Behavioral Health Crisis or need someone to talk to.  Patient verbalizes understanding of instructions and care plan provided today and agrees to view in MyChart. Active MyChart status and patient understanding of how to access instructions and care plan via MyChart confirmed with patient.    Tobias CHARM Maranda HEDWIG, PhD [facility], [facility] Social Worker Direct Dial: [phone]  Fax: [phone]

## 2024-06-06 ENCOUNTER — Other Ambulatory Visit

## 2024-06-06 ENCOUNTER — Other Ambulatory Visit: Admitting: Licensed Clinical Social Worker

## 2024-06-08 DIAGNOSIS — Z419 Encounter for procedure for purposes other than remedying health state, unspecified: Secondary | ICD-10-CM | POA: Diagnosis not present

## 2024-06-11 ENCOUNTER — Other Ambulatory Visit: Payer: Self-pay | Admitting: Licensed Clinical Social Worker
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
## 2024-06-16 ENCOUNTER — Other Ambulatory Visit: Payer: Self-pay | Admitting: Licensed Clinical Social Worker

## 2024-06-16 NOTE — Patient Outreach (Signed)
 Complex Care Management   Visit Note  06/16/2024  Name:  Beverly Joseph MRN: [id number] DOB: 09-Jun-1986  Situation: Referral received for Complex Care Management related to SDOH Barriers:  Housing [facility] application  I obtained verbal consent from Patient.  Visit completed with Patient  on the phone  Background:   Past Medical History:  Diagnosis Date   Allergy More than a decade   Anemia More than a decade   Anxiety Been a while   Arthritis Lately   Depression Been couple years now   GERD (gastroesophageal reflux disease) Been a while   Hemorrhoids 2016    Assessment:Patient has completed the [facility] application online and tries to submit it but it kept telling her it did not submit. The SW told the patient to call to see if they got it and the SW will try to download what the patient emailed  and send to to [facility] via email    SDOH Interventions    Flowsheet Row Patient Outreach from 06/11/2024 in [place] POPULATION HEALTH DEPARTMENT Patient Outreach Telephone from 06/02/2024 in [place] POPULATION HEALTH DEPARTMENT  SDOH Interventions    Food Insecurity Interventions Intervention Not Indicated Community Resources Provided  [SW will email the food pantry list ans deucated on the [employer] App]  Housing Interventions -- Intervention Not Indicated  Transportation Interventions -- Intervention Not Indicated  Utilities Interventions -- Intervention Not Indicated      Recommendation:   none  Follow Up Plan:   Telephone follow up appointment date/time:  07/03/2024 at 2:00 pm  Tobias CHARM Maranda HEDWIG, PhD [facility], [facility] Social Worker Direct Dial: [phone]  Fax: [phone]

## 2024-06-16 NOTE — Patient Instructions (Signed)
 Visit Information  Thank you for taking time to visit with me today. Please don't hesitate to contact me if I can be of assistance to you before our next scheduled appointment.  Your next care management appointment is by telephone on 07/03/2024 at 2:00 pm    Please call the care guide team at [phone] if you need to cancel, schedule, or reschedule an appointment.   Please call the Suicide and Crisis Lifeline: 988 go to [facility] [address], [place] [phone]) call 911 if you are experiencing a Mental Health or Behavioral Health Crisis or need someone to talk to.  Beverly CHARM Maranda HEDWIG, PhD [facility], [facility] Social Worker Direct Dial: [phone]  Fax: [phone]

## 2024-06-17 ENCOUNTER — Other Ambulatory Visit: Payer: Self-pay | Admitting: Licensed Clinical Social Worker
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
## 2024-06-18 NOTE — Patient Instructions (Signed)
 Visit Information  Beverly Joseph was given information about Medicaid Managed Care team care coordination services as a part of their [facility] Medicaid benefit.   If you would like to schedule transportation through your [facility] plan, please call the following number at least 2 days in advance of your appointment: [phone].   You can also use the MTM portal or MTM mobile app to manage your rides. Reimbursement for transportation is available through [facility]! For the portal, please go to [email or web address]  Call the [facility] Crisis Line at [phone], at any time, 24 hours a day, 7 days a week. If you are in danger or need immediate medical attention call 911.  Please see education materials related to topics discussed provided by MyChart link.  Patient verbalizes understanding of instructions and care plan provided today and agrees to view in MyChart. Active MyChart status and patient understanding of how to access instructions and care plan via MyChart confirmed with patient.     Licensed Clinical Social Worker will call pt on 11/19 at 1 PM  Rolin Kerns, LCSW [place]  [facility], [facility] Clinical Social Worker Direct Dial: [phone]  Fax: [phone] Website: [email or web address] 7:07 AM   Following is a copy of your plan of care:  There are no care plans that you recently modified to display for this patient.

## 2024-06-25 ENCOUNTER — Encounter: Payer: Self-pay | Admitting: Licensed Clinical Social Worker

## 2024-06-25 NOTE — Patient Outreach (Addendum)
 06/25/2024  SW spoke to the patients for the last 3 weeks and the patient decided on yesterday to leave and go stay with a friend and go to the [facility] to file a 50-B, patient was told by the advocate that she did not have enough evidence to have a 50-B protection order. SW sent some resources to the patient regarding housing and shelter and used the Find Help. SW will continue to follow up with the patient. Patient stated that she is from [place] and has family still there and does not want to go there because of the living style in [place]. She also has family in [place]  and would go there but afraid that the family will tell her that she should not have gone back to him and I told you so. The SW will continue to   I emailed you some resources  From: [facility] @[email or web address]>  Sent: Wednesday, June 25, 2024 1:09 PM To: Maranda Lister @[email or web address]> Subject: Re: Re: government grants to rent  Is there any government grants to help with renting an apartment to move? On Wed, Jun 25, 2024 at 11:?59 AM Ronal Corning <[email or web address]> wrote: It went so so that I barely slept, so I just left the [facility]. I spoke with the lawyer before ZjQcmQRYFpfptBannerStart     ZjQcmQRYFpfptBannerEnd Is there any government grants to help with renting an apartment to move?  On Wed, Jun 25, 2024 at 11:59?AM Ronal Corning @[email or web address]> wrote: It went so so that I barely slept, so I just left the [facility]. I spoke with the lawyer before going to see the judge and based on what happened there's no physical evidence for the judge to grant me an emergency 50B. I didn't even go up before the judge, which was kind of disappointing. Other than shelter is there any housing that I can have my own private BR and kitchen that I could get urgently?  On Wed, Jun 25, 2024 at 8:39?AM Maranda Lister @[email or web address] [email or web address]> wrote: How did last night go. Provide an update     From: [facility]  [email or web address]>  Sent: Tuesday, June 24, 2024 2:38 PM To: Maranda Lister @[email or web address] .[email or web address]> Subject: Re: Re:   Yes, I did! But we got there late I wasn't able to get the restraining order today I'll have to go tomorrow early. On Tue, Jun 24, 2024 at 12:?00 PM Maranda Lister <Lateefah Mallery.?[email or web address]> wrote: Ok great did you go to [facility]? From: Veanna ZjQcmQRYFpfptBannerStart     ZjQcmQRYFpfptBannerEnd Yes, I did! But we got there late I wasn't able to get the restraining order today I'll have to go tomorrow early.    On Tue, Jun 24, 2024 at 12:00?PM Maranda Lister @[place] .[email or web address]> wrote: Ok great did you go to [facility]?     From: [facility] @gmail [email or web address]>  Sent: Tuesday, June 24, 2024 11:04 AM To: Maranda Lister @[email or web address] .[email or web address]> Subject: Re: Re:   So my friend near by went over I've grabbed some stuff and gonna stay over her house, I'm waiting for another friend to come and go with me to the family court. On Tue, Jun 24, 2024 at 8:?41 AM Ronal Corning <[email or web address]> wrote:? ZjQcmQRYFpfptBannerStart     ZjQcmQRYFpfptBannerEnd So my friend near by went over I've grabbed some stuff and gonna stay over her house, I'm waiting for another friend to come and go with me to the family court.   On Tue, Jun 24, 2024 at 8:41?AM Ronal Corning @gmail .[email or web address]> wrote: Ok I'll call you right back.    On Tue, Jun 24, 2024 at 8:38?AM Maranda Lister @[place] .[email or web address]> wrote: I am available now until 10am    From: [facility] @gmail [email or web address]>  Sent: Tuesday, June 24, 2024 6:46 AM To: Maranda Lister @[email or web address]> Subject: Re: Re:   Good morning Keyonia Gluth He was home I couldn't call you. I can call you around 8 am if you're available. On Mon, Jun 23, 2024 at 4:?47 PM Maranda Lister <Laythan Hayter.?[email or web address]> wrote: Nothing just tell them why you need it, can you call ZjQcmQRYFpfptBannerStart    ZjQcmQRYFpfptBannerEnd Good morning Lister  He was home I couldn't call you. I can call you around 8 am if you're available.    On Mon, Jun 23, 2024 at 4:47?PM Maranda Lister @[place] .com> wrote: Nothing just tell them why you need it, can you call me     From: [facility] @gmail .[email or web address]>  Sent: Monday, June 23, 2024 12:32 PM To: Maranda Lister @[email or web address]> Subject: Re: Re:   What kind of proof do I need here in [place] to take him to court and get a restraining order? On Mon, Jun 23, 2024 at 11:?40 AM Ronal Corning <[email or web address]> wrote: Good morning, I didn't sleep last night I was resting a bit. Can I call ZjQcmQRYFpfptBannerStart   ZjQcmQRYFpfptBannerEnd What kind of proof do I need here in [place] to take him to court and get a restraining order?    On Mon, Jun 23, 2024 at 11:40?AM Ronal Corning @gmail .[email or web address]> wrote: Good morning,  I didn't sleep last night I was resting a bit. Can I call you right now?   On Mon, Jun 23, 2024 at 8:08?AM Maranda Lister @[place] .[email or web address]> wrote: Good morning, Just checking on you      Lister CHARM Maranda HEDWIG, PhD [facility], [facility] Social Worker Direct Dial: [phone]  Fax: [phone] Website: [email or web address]         From: [facility] @gmail .[email or web address]>  Sent: Friday, June 20, 2024 4:29 PM To: Maranda Lister @[place] .com> Subject: Re: Re:   He's still home he's supposed to go ride a bike but hasn't left yet. On Fri, Jun 20, 2024 at 4:?17 PM Maranda Lister <Chella Chapdelaine.?[email or web address]> wrote: I can be available in about 10 minutes From: [facility] <[email or web address]> ZjQcmQRYFpfptBannerStart     ZjQcmQRYFpfptBannerEnd He's still home he's supposed to go ride a bike but hasn't left yet.    On Fri, Jun 20, 2024 at 4:17?PM Maranda Lister @[email or web address] .[email or web address]> wrote: I can be available  in about 10  minutes   From: [facility] @gmail .[email or web address]>  Sent: Friday, June 20, 2024 3:46 PM To: Maranda Lister @[place] .com> Subject: Re:   Hi Kayan Blissett he's home now, until what time are you available today? On Fri, Jun 20, 2024 at 3:?17 PM Maranda Lister <Stephine [email or web address]> wrote: Hello, are you available now? From: [facility] <[email or web address]> Sent: Friday, ZjQcmQRYFpfptBannerStart     ZjQcmQRYFpfptBannerEnd Hi Miyani Cronic he's home now, until what time are you available today?   On Fri, Jun 20, 2024 at 3:17?PM Maranda Lister @ .com> wrote: Hello, are you available  now?    From: [facility] @gmail .[email or web address]>  Sent: Friday, June 20, 2024 12:41 PM To: Maranda Lister @ .com> Subject:    Hi Ondrea Dow hope your day is going well. Can you please call me if you're available? Thank you.  ZjQcmQRYFpfptBannerStart

## 2024-07-03 ENCOUNTER — Other Ambulatory Visit: Payer: Self-pay | Admitting: Licensed Clinical Social Worker

## 2024-07-03 NOTE — Patient Outreach (Signed)
 Social Drivers of Health  Community Resource and Care Coordination Visit Note   07/03/2024  Name: Beverly Joseph MRN: [id number] DOB:Sep 02, 1985  Situation: Referral received for [facility] needs assessment and assistance related to Housing . I obtained verbal consent from Patient.  Visit completed with Patient on the phone.   Background:      Assessment:   Goals Addressed             This Visit's Progress    BSW VBCI Social Work Care Plan       Problems:   Food Insecurity  and LCSW referral   CSW Clinical Goal(s):   Over the next 2 weeks the Patient will will follow up with Food resources and utility resources as directed by Social Work.  Interventions:  Will email resources for food pantry and utility   Patient Goals/Self-Care Activities:  Meet with LCSW on 1010/2025  Plan:   Telephone follow up appointment with care management team member scheduled for:  07/23/2024 at 2:00 pm        Recommendation:   Follow up with marriage and couples counseling with pastor and husband   Follow Up Plan:   Telephone follow up appointment date/time:  07/23/2024 at 2:00 pm  Tobias CHARM Maranda HEDWIG, PhD [facility], [facility] Social Worker Direct Dial: [phone]  Fax: [phone]

## 2024-07-03 NOTE — Patient Instructions (Signed)
 Visit Information  Thank you for taking time to visit with me today. Please don't hesitate to contact me if I can be of assistance to you before our next scheduled appointment.  Your next care management appointment is by telephone on 07/23/2024 at 2:00 pm   Please call the care guide team at [phone] if you need to cancel, schedule, or reschedule an appointment.   Please call the Suicide and Crisis Lifeline: 988 go to [facility] Urgent [facility] [address] [place] [phone]) call 911 if you are experiencing a Mental Health or Behavioral Health Crisis or need someone to talk to.  Tobias CHARM Maranda HEDWIG, PhD [facility], [facility] Social Worker Direct Dial: [phone]  Fax: [phone]

## 2024-07-07 ENCOUNTER — Ambulatory Visit: Admitting: Family Medicine

## 2024-07-07 ENCOUNTER — Ambulatory Visit: Payer: Self-pay

## 2024-07-07 VITALS — BP 90/58 | HR 74 | Temp 98.5°F | Ht 65.0 in | Wt 153.5 lb

## 2024-07-07 DIAGNOSIS — R0789 Other chest pain: Secondary | ICD-10-CM | POA: Diagnosis not present

## 2024-07-07 DIAGNOSIS — F419 Anxiety disorder, unspecified: Secondary | ICD-10-CM

## 2024-07-07 DIAGNOSIS — N926 Irregular menstruation, unspecified: Secondary | ICD-10-CM

## 2024-07-07 DIAGNOSIS — D508 Other iron deficiency anemias: Secondary | ICD-10-CM | POA: Diagnosis not present

## 2024-07-07 LAB — PREGNANCY, URINE: Preg Test, Ur: POSITIVE — AB
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
## 2024-07-07 NOTE — Telephone Encounter (Signed)
 FYI Only or Action Required?: FYI only for provider: appointment scheduled on 07/07/2024.  Patient was last seen in primary care on 05/27/2024 by Aletha Bene, MD.  Called Nurse Triage reporting Chest Pain.  Symptoms began today.  Interventions attempted: Nothing.  Symptoms are: stable.  Triage Disposition: Call PCP Within 24 Hours  Patient/caregiver understands and will follow disposition?: Yes   Copied from CRM #[id number]. Topic: Clinical - Red Word Triage >> Jul 07, 2024  7:56 AM Montie POUR wrote: Red Word that prompted transfer to Nurse Triage:  Chest pain at a level 7; This has been going on for 2 days; She wants to test to get a pregnancy test Reason for Disposition  [1] Patient says chest pain feels exactly the same as previously diagnosed heartburn AND [2] describes burning in chest AND [3] accompanying sour taste in mouth  Answer Assessment - Initial Assessment Questions 1. LOCATION: Where does it hurt?      upper right chest and middle chest 2. RADIATION: Does the pain go anywhere else? (e.g., into neck, jaw, arms, back)     Neck back on back side and stiffness 3. ONSET: When did the chest pain begin? (Minutes, hours or days)      Saturday in upper right chest and Sunday middle chest 4. PATTERN: Does the pain come and go, or has it been constant since it started?  Does it get worse with exertion?      Comes and goes and none today 5. DURATION: How long does it last (e.g., seconds, minutes, hours)     Last for a while and then goes away 6. SEVERITY: How bad is the pain?  (e.g., Scale 1-10; mild, moderate, or severe)     7/10 7. CARDIAC RISK FACTORS: Do you have any history of heart problems or risk factors for heart disease? (e.g., angina, prior heart attack; diabetes, high blood pressure, high cholesterol, smoker, or strong family history of heart disease)     Heart palpitations at times & none now 8. PULMONARY RISK FACTORS: Do you have any history  of lung disease?  (e.g., blood clots in lung, asthma, emphysema, birth control pills)     Na 9. CAUSE: What do you think is causing the chest pain?     unknown 10. OTHER SYMPTOMS: Do you have any other symptoms? (e.g., dizziness, nausea, vomiting, sweating, fever, difficulty breathing, cough)       Low back pain w/bending, SOB w/exertion, lightheaded & dizziness, reflux feeling at times, bloating 11. PREGNANCY: Is there any chance you are pregnant? When was your last menstrual period?       Unsure  C/o acid feeling going up in throat on Saturday  Protocols used: Chest Pain-A-AH

## 2024-07-08 ENCOUNTER — Encounter: Payer: Self-pay | Admitting: Obstetrics and Gynecology

## 2024-07-08 ENCOUNTER — Encounter: Payer: Self-pay | Admitting: Family Medicine

## 2024-07-08 ENCOUNTER — Ambulatory Visit: Payer: Self-pay | Admitting: Family Medicine

## 2024-07-08 LAB — CBC WITH DIFFERENTIAL/PLATELET
Absolute Lymphocytes: 3111 {cells}/uL (ref 850–3900)
Absolute Monocytes: 742 {cells}/uL (ref 200–950)
Basophils Absolute: 31 {cells}/uL (ref 0–200)
Basophils Relative: 0.3 %
Eosinophils Absolute: 52 {cells}/uL (ref 15–500)
Eosinophils Relative: 0.5 %
HCT: 34.1 % — ABNORMAL LOW (ref 35.0–45.0)
Hemoglobin: 10.7 g/dL — ABNORMAL LOW (ref 11.7–15.5)
MCH: 27.1 pg (ref 27.0–33.0)
MCHC: 31.4 g/dL — ABNORMAL LOW (ref 32.0–36.0)
MCV: 86.3 fL (ref 80.0–100.0)
MPV: 10.9 fL (ref 7.5–12.5)
Monocytes Relative: 7.2 %
Neutro Abs: 6365 {cells}/uL (ref 1500–7800)
Neutrophils Relative %: 61.8 %
Platelets: 252 Thousand/uL (ref 140–400)
RBC: 3.95 Million/uL (ref 3.80–5.10)
RDW: 13.3 % (ref 11.0–15.0)
Total Lymphocyte: 30.2 %
WBC: 10.3 Thousand/uL (ref 3.8–10.8)

## 2024-07-08 LAB — IRON,TIBC AND FERRITIN PANEL
%SAT: 15 % — ABNORMAL LOW (ref 16–45)
Ferritin: 9 ng/mL — ABNORMAL LOW (ref 16–154)
Iron: 61 ug/dL (ref 40–190)
TIBC: 405 ug/dL (ref 250–450)

## 2024-07-09 DIAGNOSIS — Z419 Encounter for procedure for purposes other than remedying health state, unspecified: Secondary | ICD-10-CM | POA: Diagnosis not present

## 2024-07-12 ENCOUNTER — Inpatient Hospital Stay (HOSPITAL_COMMUNITY)
Admission: AD | Admit: 2024-07-12 | Discharge: 2024-07-12 | Disposition: A | Discharge status: Home / Self Care | Attending: Obstetrics & Gynecology | Admitting: Obstetrics & Gynecology

## 2024-07-12 ENCOUNTER — Inpatient Hospital Stay (HOSPITAL_COMMUNITY)

## 2024-07-12 ENCOUNTER — Encounter (HOSPITAL_COMMUNITY): Payer: Self-pay | Admitting: *Deleted

## 2024-07-12 DIAGNOSIS — Z3490 Encounter for supervision of normal pregnancy, unspecified, unspecified trimester: Secondary | ICD-10-CM

## 2024-07-12 DIAGNOSIS — O26891 Other specified pregnancy related conditions, first trimester: Secondary | ICD-10-CM | POA: Diagnosis not present

## 2024-07-12 DIAGNOSIS — Z3A01 Less than 8 weeks gestation of pregnancy: Secondary | ICD-10-CM | POA: Diagnosis not present

## 2024-07-12 DIAGNOSIS — O26851 Spotting complicating pregnancy, first trimester: Secondary | ICD-10-CM | POA: Insufficient documentation

## 2024-07-12 DIAGNOSIS — R110 Nausea: Secondary | ICD-10-CM | POA: Diagnosis not present

## 2024-07-12 DIAGNOSIS — R109 Unspecified abdominal pain: Secondary | ICD-10-CM | POA: Diagnosis not present

## 2024-07-12 DIAGNOSIS — O09521 Supervision of elderly multigravida, first trimester: Secondary | ICD-10-CM | POA: Diagnosis not present

## 2024-07-12 DIAGNOSIS — O209 Hemorrhage in early pregnancy, unspecified: Secondary | ICD-10-CM | POA: Diagnosis present

## 2024-07-12 HISTORY — DX: Urinary tract infection, site not specified: N39.0

## 2024-07-12 HISTORY — DX: Unspecified ovarian cyst, unspecified side: N83.209

## 2024-07-12 HISTORY — DX: Headache, unspecified: R51.9

## 2024-07-12 LAB — URINALYSIS, ROUTINE W REFLEX MICROSCOPIC
Bilirubin Urine: NEGATIVE
Glucose, UA: NEGATIVE mg/dL
Ketones, ur: NEGATIVE mg/dL
Leukocytes,Ua: NEGATIVE
Nitrite: NEGATIVE
Protein, ur: NEGATIVE mg/dL
Specific Gravity, Urine: 1.023 (ref 1.005–1.030)
pH: 6 (ref 5.0–8.0)

## 2024-07-12 LAB — CBC
HCT: 33.3 % — ABNORMAL LOW (ref 36.0–46.0)
Hemoglobin: 10.8 g/dL — ABNORMAL LOW (ref 12.0–15.0)
MCH: 27.5 pg (ref 26.0–34.0)
MCHC: 32.4 g/dL (ref 30.0–36.0)
MCV: 84.7 fL (ref 80.0–100.0)
Platelets: 267 K/uL (ref 150–400)
RBC: 3.93 MIL/uL (ref 3.87–5.11)
RDW: 14.6 % (ref 11.5–15.5)
WBC: 10.1 K/uL (ref 4.0–10.5)
nRBC: 0 % (ref 0.0–0.2)

## 2024-07-12 LAB — WET PREP, GENITAL
Clue Cells Wet Prep HPF POC: NONE SEEN
Sperm: NONE SEEN
Trich, Wet Prep: NONE SEEN
WBC, Wet Prep HPF POC: <10 (ref <10)
Yeast Wet Prep HPF POC: NONE SEEN

## 2024-07-12 LAB — HCG, QUANTITATIVE, PREGNANCY: hCG, Beta Chain, Quant, S: 24694 m[IU]/mL — ABNORMAL HIGH (ref <5)

## 2024-07-12 MED ORDER — CALCIUM CARBONATE ANTACID 500 MG PO CHEW
2.0000 | CHEWABLE_TABLET | Freq: Once | ORAL | Status: AC
  Administered 2024-07-12: 400 mg via ORAL
  Filled 2024-07-12: qty 2

## 2024-07-12 NOTE — MAU Provider Note (Signed)
 Chief Complaint:  Vaginal Bleeding   HPI   None     Beverly Joseph is a 38 y.o. [phone] at [redacted]w[redacted]d who presents to maternity admissions reporting a mix of dark brown and bright red spotting with one small clot since this morning. Also describes tugging pain around umbilicus last night. Denies any pain this morning. Also nauseous. When she did the swabs in the MAU, she noted some brown blood on the swabs but nothing when wiping. Last episode of sexual intercourse 2 weeks ago.  Pregnancy Course: Has upcoming appointments in December to establish care.   Past Medical History:  Diagnosis Date   Allergy More than a decade   Anemia More than a decade   Anxiety Been a while   Arthritis Lately   Depression Been couple years now   GERD (gastroesophageal reflux disease) Been a while   Headache    Hemorrhoids 2016   Ovarian cyst    UTI (urinary tract infection)    OB History  Gravida Para Term Preterm AB Living  4 2 2  1 2   SAB IAB Ectopic Multiple Live Births  1    2    # Outcome Date GA Lbr Len/2nd Weight Sex Type Anes PTL Lv  4 Current           3 Term 2023    F Vag-Spont EPI N LIV  2 Term 2016    M Vag-Spont EPI N LIV  1 SAB 2010           Past Surgical History:  Procedure Laterality Date   TONSILLECTOMY     WISDOM TOOTH EXTRACTION     Family History  Problem Relation Age of Onset   Hypertension Mother    Depression Mother    Miscarriages / Stillbirths Mother    Hypertension Father    Parkinson's disease Father    Asthma Sister    Varicose Veins Sister    Miscarriages / Stillbirths Sister    ADD / ADHD Son    Learning disabilities Son    Parkinson's disease Paternal Uncle    Parkinson's disease Paternal Grandfather    Thyroid  cancer Neg Hx    Social History   Tobacco Use   Smoking status: Never   Smokeless tobacco: Never  Vaping Use   Vaping status: Never Used  Substance Use Topics   Alcohol use: Never   Drug use: Never   Allergies  Allergen Reactions   Latex      Makes her sneeze   Penicillins Nausea And Vomiting   No medications prior to admission.    I have reviewed patient's Past Medical Hx, Surgical Hx, Family Hx, Social Hx, medications and allergies.   ROS  Pertinent items noted in HPI and remainder of comprehensive ROS otherwise negative.   PHYSICAL EXAM  Patient Vitals for the past 24 hrs:  BP Temp Temp src Pulse Resp SpO2 Height Weight  07/12/24 1017 (!) 99/49 98.6 F (37 C) Oral 85 15 100 % 5' 5 (1.651 m) 69.9 kg    Constitutional: Well-developed, well-nourished female in no acute distress.  Cardiovascular: Warm and well-perfused Respiratory: normal effort, no problems with respiration noted GI: Abd soft, non-tender, non-distended MS: Extremities nontender, no edema, normal ROM Neurologic: Alert and oriented x 4.  Pelvic: deferred      Labs: Results for orders placed or performed during the hospital encounter of 07/12/24 (from the past 24 hours)  Urinalysis, Routine w reflex microscopic -Urine, Clean Catch  Status: Abnormal   Collection Time: 07/12/24 10:27 AM  Result Value Ref Range   Color, Urine YELLOW YELLOW   APPearance HAZY (A) CLEAR   Specific Gravity, Urine 1.023 1.005 - 1.030   pH 6.0 5.0 - 8.0   Glucose, UA NEGATIVE NEGATIVE mg/dL   Hgb urine dipstick MODERATE (A) NEGATIVE   Bilirubin Urine NEGATIVE NEGATIVE   Ketones, ur NEGATIVE NEGATIVE mg/dL   Protein, ur NEGATIVE NEGATIVE mg/dL   Nitrite NEGATIVE NEGATIVE   Leukocytes,Ua NEGATIVE NEGATIVE   RBC / HPF 0-5 0 - 5 RBC/hpf   WBC, UA 0-5 0 - 5 WBC/hpf   Bacteria, UA RARE (A) NONE SEEN   Squamous Epithelial / HPF 0-5 0 - 5 /HPF   Mucus PRESENT   Wet prep, genital     Status: None   Collection Time: 07/12/24 10:27 AM   Specimen: Urine, Clean Catch  Result Value Ref Range   Yeast Wet Prep HPF POC NONE SEEN NONE SEEN   Trich, Wet Prep NONE SEEN NONE SEEN   Clue Cells Wet Prep HPF POC NONE SEEN NONE SEEN   WBC, Wet Prep HPF POC <10 <10   Sperm  NONE SEEN   CBC     Status: Abnormal   Collection Time: 07/12/24 11:35 AM  Result Value Ref Range   WBC 10.1 4.0 - 10.5 K/uL   RBC 3.93 3.87 - 5.11 MIL/uL   Hemoglobin 10.8 (L) 12.0 - 15.0 g/dL   HCT 66.6 (L) 63.9 - 53.9 %   MCV 84.7 80.0 - 100.0 fL   MCH 27.5 26.0 - 34.0 pg   MCHC 32.4 30.0 - 36.0 g/dL   RDW 85.3 88.4 - 84.4 %   Platelets 267 150 - 400 K/uL   nRBC 0.0 0.0 - 0.2 %  hCG, quantitative, pregnancy     Status: Abnormal   Collection Time: 07/12/24 11:35 AM  Result Value Ref Range   hCG, Beta Chain, Quant, S 24,694 (H) <5 mIU/mL    Imaging:  US  OB LESS THAN 14 WEEKS WITH OB TRANSVAGINAL Result Date: 07/12/2024 CLINICAL DATA:  [id number] [redacted] weeks gestation of pregnancy [id number] Abdominal pain affecting pregnancy [id number] EXAM: OBSTETRIC <14 WK US  AND TRANSVAGINAL OB US  TECHNIQUE: Both transabdominal and transvaginal ultrasound examinations were performed for complete evaluation of the gestation as well as the maternal uterus, adnexal regions, and pelvic cul-de-sac. Transvaginal technique was performed to assess early pregnancy. COMPARISON:  None Available. FINDINGS: Intrauterine gestational sac: Single Yolk sac:  Visualized. Embryo:  Visualized. Cardiac Activity: Suboptimally assessed given tiny size but favored visualized. Heart Rate: 127 bpm CRL:  2.2 mm   5 w   5 d LMP: 06/02/2024. Gestational age by LMP is 5 weeks 5 days. EDC by LMP is 03/09/2025. Subchorionic hemorrhage: None Right ovary: Measures 2.2 x 1.7 x 2.0 cm for an estimated volume of 3.8 ML. Left ovary: Measures 2.3 x 0.9 x 1.8 cm for an estimated volume of 2.1 ML. Other :None Free fluid:  None IMPRESSION: Single intrauterine pregnancy with favored cardiac activity documented. Estimated gestational age assigned by LMP is 5 weeks 5 days with an EDC of 03/09/2025. Electronically Signed   By: Corean Salter M.D.   On: 07/12/2024 12:29    MDM & MAU COURSE  MDM: Low  MAU Course: Orders Placed This Encounter   Procedures   Wet prep, genital   US  OB LESS THAN 14 WEEKS WITH OB TRANSVAGINAL   Urinalysis, Routine w reflex microscopic -Urine, Clean  Catch   CBC   hCG, quantitative, pregnancy   Discharge patient   Meds ordered this encounter  Medications   calcium carbonate (TUMS - dosed in mg elemental calcium) chewable tablet 400 mg of elemental calcium   VSS. Exam unremarkable. Requesting medication for heartburn. Wet prep and UA overall unremarkable. Denies bleeding like a period or abdominal pain or cramping at this time. Throughout conversation, started to note RLQ pain although non tender to palpation on exam. CUBA workup ordered. Ultrasound showing IUP with cardiac activity. Discussed results with patient. All questions answered prior to discharge.  ASSESSMENT   1. [redacted] weeks gestation of pregnancy   2. Spotting affecting pregnancy in first trimester   3. Intrauterine pregnancy     PLAN  Discharge home in stable condition with return precautions.  Follow up with OB in December as scheduled.    Allergies as of 07/12/2024       Reactions   Latex    Makes her sneeze   Penicillins Nausea And Vomiting        Medication List     TAKE these medications    acetaminophen  325 MG tablet Commonly known as: Tylenol  Take 2 tablets (650 mg total) by mouth every 4 (four) hours as needed (for pain scale < 4).   aspirin-acetaminophen -caffeine 250-250-65 MG tablet Commonly known as: EXCEDRIN MIGRAINE Take by mouth every 6 (six) hours as needed for headache.   ferrous sulfate 325 (65 FE) MG tablet Take 1 tablet (325 mg total) by mouth daily with breakfast.   fluticasone  50 MCG/ACT nasal spray Commonly known as: FLONASE  Place 2 sprays into both nostrils daily for 5 days.   sertraline  25 MG tablet Commonly known as: ZOLOFT  Take 1 tablet (25 mg total) by mouth daily.        Charlie Courts, MD  Family Medicine - Obstetrics Fellow

## 2024-07-12 NOTE — MAU Note (Signed)
 Beverly Joseph is a 38 y.o. at [redacted]w[redacted]d here in MAU reporting: noted spotting this morning, brownish, little red noted when cleaning up.  Denies pain today.   Onset of complaint: this morning Pain score: none Vitals:   07/12/24 1017  BP: (!) 99/49  Pulse: 85  Resp: 15  Temp: 98.6 F (37 C)  SpO2: 100%      Lab orders placed from triage:  urine, vag swabs

## 2024-07-12 NOTE — Discharge Instructions (Addendum)
 You came into the MAU because you had an episode of spotting this morning. You aren't having any pain or cramping, and haven't had more spotting or bleeding today. We did a urine test and a swab for infection which were overall negative. We did an ultrasound which showed a pregnancy inside of the uterus. Please come back to the MAU if you have continued spotting that becomes bleeding, or if you are having consistent cramping or lower abdominal pain.   I also attached some information about toxoplasmosis, which is an infection that pregnant women can get from cats. Please make sure you let your providers know that you have a cat when you have your prenatal visits.

## 2024-07-14 LAB — GC/CHLAMYDIA PROBE AMP (~~LOC~~) NOT AT ARMC
Chlamydia: NEGATIVE
Comment: NEGATIVE
Comment: NORMAL
Neisseria Gonorrhea: NEGATIVE

## 2024-07-16 ENCOUNTER — Telehealth: Payer: Self-pay | Admitting: Licensed Clinical Social Worker

## 2024-07-16 ENCOUNTER — Encounter: Payer: Self-pay | Admitting: Licensed Clinical Social Worker

## 2024-07-23 ENCOUNTER — Other Ambulatory Visit: Payer: Self-pay | Admitting: Licensed Clinical Social Worker

## 2024-07-23 DIAGNOSIS — H04123 Dry eye syndrome of bilateral lacrimal glands: Secondary | ICD-10-CM | POA: Diagnosis not present

## 2024-07-25 ENCOUNTER — Encounter: Payer: Self-pay | Admitting: Family Medicine

## 2024-07-30 ENCOUNTER — Other Ambulatory Visit: Payer: Self-pay | Admitting: Licensed Clinical Social Worker
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
## 2024-07-30 NOTE — Patient Instructions (Signed)
 Visit Information  Thank you for taking time to visit with me today. Please don't hesitate to contact me if I can be of assistance to you before our next scheduled appointment.  Your next care management appointment is no further scheduled appointments.  on     Please call the care guide team at [phone] if you need to cancel, schedule, or reschedule an appointment.   Please call the Suicide and Crisis Lifeline: 988 go to [facility] Urgent [facility] [address], [place] [phone]) call 911 if you are experiencing a Mental Health or Behavioral Health Crisis or need someone to talk to.  Tobias CHARM Maranda HEDWIG, PhD [facility], [facility] Social Worker Direct Dial: [phone]  Fax: [phone]

## 2024-08-06 ENCOUNTER — Other Ambulatory Visit: Payer: Self-pay

## 2024-08-06 ENCOUNTER — Inpatient Hospital Stay (HOSPITAL_COMMUNITY)
Admission: AD | Admit: 2024-08-06 | Discharge: 2024-08-06 | Disposition: A | Discharge status: Home / Self Care | Attending: Obstetrics & Gynecology | Admitting: Obstetrics & Gynecology

## 2024-08-06 ENCOUNTER — Encounter (HOSPITAL_COMMUNITY): Payer: Self-pay | Admitting: Obstetrics & Gynecology

## 2024-08-06 DIAGNOSIS — O26891 Other specified pregnancy related conditions, first trimester: Secondary | ICD-10-CM

## 2024-08-06 DIAGNOSIS — Z91040 Latex allergy status: Secondary | ICD-10-CM | POA: Diagnosis not present

## 2024-08-06 DIAGNOSIS — O219 Vomiting of pregnancy, unspecified: Secondary | ICD-10-CM | POA: Diagnosis not present

## 2024-08-06 DIAGNOSIS — R112 Nausea with vomiting, unspecified: Secondary | ICD-10-CM | POA: Diagnosis not present

## 2024-08-06 DIAGNOSIS — E860 Dehydration: Secondary | ICD-10-CM | POA: Diagnosis not present

## 2024-08-06 DIAGNOSIS — Z3A09 9 weeks gestation of pregnancy: Secondary | ICD-10-CM | POA: Diagnosis not present

## 2024-08-06 DIAGNOSIS — O99281 Endocrine, nutritional and metabolic diseases complicating pregnancy, first trimester: Secondary | ICD-10-CM | POA: Insufficient documentation

## 2024-08-06 DIAGNOSIS — K117 Disturbances of salivary secretion: Secondary | ICD-10-CM | POA: Diagnosis not present

## 2024-08-06 LAB — URINALYSIS, ROUTINE W REFLEX MICROSCOPIC
Bilirubin Urine: NEGATIVE
Glucose, UA: NEGATIVE mg/dL
Ketones, ur: NEGATIVE mg/dL
Leukocytes,Ua: NEGATIVE
Nitrite: NEGATIVE
Protein, ur: NEGATIVE mg/dL
Specific Gravity, Urine: 1.025 (ref 1.005–1.030)
pH: 6 (ref 5.0–8.0)

## 2024-08-06 LAB — COMPREHENSIVE METABOLIC PANEL WITH GFR
ALT: 15 U/L (ref 0–44)
AST: 19 U/L (ref 15–41)
Albumin: 3.8 g/dL (ref 3.5–5.0)
Alkaline Phosphatase: 51 U/L (ref 38–126)
Anion gap: 9 (ref 5–15)
BUN: 8 mg/dL (ref 6–20)
CO2: 24 mmol/L (ref 22–32)
Calcium: 9.3 mg/dL (ref 8.9–10.3)
Chloride: 100 mmol/L (ref 98–111)
Creatinine, Ser: 0.56 mg/dL (ref 0.44–1.00)
GFR, Estimated: >60 mL/min (ref >60)
Glucose, Bld: 72 mg/dL (ref 70–99)
Potassium: 3.9 mmol/L (ref 3.5–5.1)
Sodium: 133 mmol/L — ABNORMAL LOW (ref 135–145)
Total Bilirubin: 0.5 mg/dL (ref 0.0–1.2)
Total Protein: 7.9 g/dL (ref 6.5–8.1)

## 2024-08-06 LAB — CBC WITH DIFFERENTIAL/PLATELET
Abs Immature Granulocytes: 0.03 K/uL (ref 0.00–0.07)
Basophils Absolute: 0 K/uL (ref 0.0–0.1)
Basophils Relative: 0 %
Eosinophils Absolute: 0.1 K/uL (ref 0.0–0.5)
Eosinophils Relative: 1 %
HCT: 34.5 % — ABNORMAL LOW (ref 36.0–46.0)
Hemoglobin: 11.5 g/dL — ABNORMAL LOW (ref 12.0–15.0)
Immature Granulocytes: 0 %
Lymphocytes Relative: 22 %
Lymphs Abs: 2.9 K/uL (ref 0.7–4.0)
MCH: 28.4 pg (ref 26.0–34.0)
MCHC: 33.3 g/dL (ref 30.0–36.0)
MCV: 85.2 fL (ref 80.0–100.0)
Monocytes Absolute: 0.9 K/uL (ref 0.1–1.0)
Monocytes Relative: 7 %
Neutro Abs: 9.1 K/uL — ABNORMAL HIGH (ref 1.7–7.7)
Neutrophils Relative %: 70 %
Platelets: 270 K/uL (ref 150–400)
RBC: 4.05 MIL/uL (ref 3.87–5.11)
RDW: 14.6 % (ref 11.5–15.5)
WBC: 13 K/uL — ABNORMAL HIGH (ref 4.0–10.5)
nRBC: 0 % (ref 0.0–0.2)

## 2024-08-06 LAB — URINALYSIS, MICROSCOPIC (REFLEX)

## 2024-08-06 MED ORDER — DOXYLAMINE-PYRIDOXINE 10-10 MG PO TBEC: DELAYED_RELEASE_TABLET | ORAL | 1 refills | Status: AC

## 2024-08-06 MED ORDER — PROMETHAZINE HCL 25 MG PO TABS: 25.0000 mg | ORAL_TABLET | Freq: Four times a day (QID) | ORAL | 0 refills | Status: AC | PRN

## 2024-08-06 MED ORDER — VITAFOL ULTRA 29-0.6-0.4-200 MG PO CAPS: 1.0000 | ORAL_CAPSULE | Freq: Every day | ORAL | 11 refills | Status: AC

## 2024-08-06 MED ORDER — GLYCOPYRROLATE 1 MG PO TABS: 1.0000 mg | ORAL_TABLET | Freq: Three times a day (TID) | ORAL | 0 refills | Status: AC

## 2024-08-06 MED ORDER — FAMOTIDINE 20 MG PO TABS: 20.0000 mg | ORAL_TABLET | Freq: Two times a day (BID) | ORAL | 0 refills | Status: AC

## 2024-08-06 MED ORDER — GLYCOPYRROLATE 0.2 MG/ML IJ SOLN
0.2000 mg | Freq: Once | INTRAMUSCULAR | Status: AC
  Administered 2024-08-06: 0.2 mg via INTRAVENOUS
  Filled 2024-08-06: qty 1

## 2024-08-06 MED ORDER — FAMOTIDINE IN NACL 20-0.9 MG/50ML-% IV SOLN
20.0000 mg | Freq: Once | INTRAVENOUS | Status: AC
  Administered 2024-08-06: 20 mg via INTRAVENOUS
  Filled 2024-08-06: qty 50

## 2024-08-06 MED ORDER — LACTATED RINGERS IV BOLUS
1000.0000 mL | Freq: Once | INTRAVENOUS | Status: AC
  Administered 2024-08-06: 1000 mL via INTRAVENOUS

## 2024-08-06 MED ORDER — SODIUM CHLORIDE 0.9 % IV SOLN
25.0000 mg | Freq: Once | INTRAVENOUS | Status: AC
  Administered 2024-08-06: 25 mg via INTRAVENOUS
  Filled 2024-08-06: qty 1

## 2024-08-06 NOTE — MAU Provider Note (Signed)
 None     S [name] is a 38 y.o. [phone] pregnant female at [redacted]w[redacted]d who presents to MAU today with complaint of nausea and vomiting that has gotten worse. She reports approximately 8 episodes of vomiting in the past 24 hours. She denies VB or LOF or pain. She additionally reports frequent spitting. She reports she has a ride home.   Receives care at [facility]. Prenatal records reviewed.  Pertinent items noted in HPI and remainder of comprehensive ROS otherwise negative.   O BP 100/64   Pulse 84   Temp 98.7 F (37.1 C) (Oral)   Resp 20   Ht 5' 5 (1.651 m)   Wt 69.1 kg   LMP 06/02/2024 (Exact Date)   SpO2 100%   BMI 25.34 kg/m  Physical Exam Vitals reviewed.  Constitutional:      General: She is not in acute distress.    Appearance: Normal appearance. She is ill-appearing. She is not toxic-appearing or diaphoretic.  HENT:     Head: Normocephalic.  Cardiovascular:     Rate and Rhythm: Normal rate and regular rhythm.     Pulses: Normal pulses.     Heart sounds: Normal heart sounds.  Pulmonary:     Effort: Pulmonary effort is normal.     Breath sounds: Normal breath sounds.  Skin:    General: Skin is warm and dry.     Capillary Refill: Capillary refill takes less than 2 seconds.  Neurological:     General: No focal deficit present.     Mental Status: She is alert and oriented to person, place, and time.  Psychiatric:        Mood and Affect: Mood normal.        Behavior: Behavior normal.        Thought Content: Thought content normal.        Judgment: Judgment normal.      MDM:  Low  MAU Course:  Patient with NV, IV fluid rehydration indicated. She received 1L of fluid with good response, phenergan , robinul  and pepcid  added for nausea, spitting and heartburn respectively. Patient notes relief. Able to tolerate fluids, declines PO challenge with solids.  .   A Nausea and vomiting, unspecified vomiting type  Hypersalivation  [redacted] weeks gestation of  pregnancy  Dehydration  Medical screening exam complete  P Discharge from MAU in stable condition with routine precautions Follow up at [facility] as scheduled for ongoing prenatal care Prescriptions for robinul  1mg  TID PRN for hypersalivation ordered. - Phenergan  25mg  PO q6h for nausea ordered.  - Pepcid  20mg  daily for heartburn prescribed.  Allergies as of 08/06/2024       Reactions   Latex    Makes her sneeze   Penicillins Nausea And Vomiting        Medication List     STOP taking these medications    acetaminophen  325 MG tablet Commonly known as: Tylenol    aspirin-acetaminophen -caffeine 250-250-65 MG tablet Commonly known as: EXCEDRIN MIGRAINE   fluticasone  50 MCG/ACT nasal spray Commonly known as: FLONASE    sertraline  25 MG tablet Commonly known as: ZOLOFT        TAKE these medications    Doxylamine-Pyridoxine 10-10 MG Tbec Commonly known as: Diclegis Take 2 tabs at bedtime. Can add 1 tab in morning & 1 tab in afternoon if needed   ferrous sulfate  325 (65 FE) MG tablet Take 1 tablet (325 mg total) by mouth daily with breakfast.   glycopyrrolate  1 MG tablet Commonly known as:  Robinul  Take 1 tablet (1 mg total) by mouth 3 (three) times daily.   promethazine  25 MG tablet Commonly known as: PHENERGAN  Take 1 tablet (25 mg total) by mouth every 6 (six) hours as needed for nausea or vomiting.   Vitafol Ultra 29-0.6-0.4-200 MG Caps Take 1 capsule by mouth daily.        Camie Rote, MSN, CNM 08/06/2024 1:55 PM  Certified Nurse Midwife, [facility]

## 2024-08-06 NOTE — Discharge Instructions (Signed)
 Ms Petkus,  It was a pleasure meeting you in MAU today. I'm sorry you're not feeling well.  I recommend a bland diet for 24-48 hours until symptoms subside. I also recommend electrolyte beverages such as Gatorade, Powerade, Liquid IV, etc.   Prescriptions for the nausea and spitting have been sent to your pharmacy. Please follow up with your prenatal care provider.  Thank you for trusting us  to care for you, Camie, Midwife

## 2024-08-06 NOTE — MAU Note (Signed)
 Beverly Joseph is a 38 y.o. at [redacted]w[redacted]d here in MAU reporting: she hasn't been able to keep anything down since yesterday, states now vomiting bile and having burning in throat. Reports has vomited more than 8x in past 24 hours.  Denies both pain and VB.  LMP: 06/02/2024 Onset of complaint: yesterday Pain score: 0 Vitals:   08/06/24 1119  BP: (!) 100/49  Pulse: 81  Resp: 20  Temp: 98.7 F (37.1 C)  SpO2: 100%     FHT: NA  Lab orders placed from triage: UA

## 2024-08-07 ENCOUNTER — Encounter: Payer: Self-pay | Admitting: Licensed Clinical Social Worker

## 2024-08-07 ENCOUNTER — Ambulatory Visit: Admitting: Licensed Clinical Social Worker

## 2024-08-07 DIAGNOSIS — F53 Postpartum depression: Secondary | ICD-10-CM
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
## 2024-08-08 DIAGNOSIS — Z419 Encounter for procedure for purposes other than remedying health state, unspecified: Secondary | ICD-10-CM | POA: Diagnosis not present

## 2024-08-11 NOTE — Progress Notes (Unsigned)
 New OB Intake  I connected with Beverly Joseph  on 08/11/2024 at  8:15 AM EST by MyChart Video Visit and verified that I am speaking with the correct person using two identifiers. Nurse is located at [facility] and pt is located at ***.  I discussed the limitations, risks, security and privacy concerns of performing an evaluation and management service by telephone and the availability of in person appointments. I also discussed with the patient that there may be a patient responsible charge related to this service. The patient expressed understanding and agreed to proceed.  I explained I am completing New OB Intake today. We discussed EDD of 03/09/2024 based on LMP of 06/02/2024. Pt is [id number]. I reviewed her allergies, medications and Medical/Surgical/OB history.    Patient Active Problem List   Diagnosis Date Noted   Anxiety    Anemia affecting pregnancy 06/06/2022   Family history of autism 01/19/2022   Hemorrhoid 11/19/2014   Iron  (Fe) deficiency anemia 01/15/2005     Concerns addressed today  Delivery Plans Plans to deliver at [facility]. Discussed the nature of our practice with multiple providers including residents and students as well as female and female providers. Due to the size of the practice, the delivering provider may not be the same as those providing prenatal care.   Patient {Is/is not:9024} interested in water birth.  MyChart/Babyscripts MyChart access verified. I explained pt will have some visits in office and some virtually. Babyscripts instructions given and order placed. Patient verifies receipt of registration text/e-mail. Account successfully created and app downloaded. If patient is a candidate for Optimized scheduling, add to sticky note.   Blood Pressure Cuff/Weight Scale {blood pressure cuff:24241} Explained after first prenatal appt pt will check weekly and document in Babyscripts. Patient {weight scale:28336}.  Anatomy US  Explained first scheduled US  will be  around 19 weeks. Anatomy US  scheduled for *** at ***.  Is patient a CenteringPregnancy candidate?  {Accepted:19197::Accepted,Not a Candidate,Declined} Declined due to {Declined:19197::Schedule,Childcare,Group setting,Support person concern,Declined to say,Enrolled in [facility],***} Not a candidate due to {Not a Candidate:19197::DM,CHTN, medication controlled,Language barrier,>28 weeks,Multiple gestation (mono-mono or mono-di),Complex coordination of care needed,***} If accepted,    Is patient a Mom+Baby Combined Care candidate?  Not a candidate   If accepted, confirm patient does not intend to move from the area for at least 12 months, then notify Mom+Baby staff  Is patient a candidate for Babyscripts Optimization? {babyscripts:31704}   First visit review I reviewed new OB appt with patient. Explained pt will be seen by Dr. Eldonna at first visit. Discussed [place] genetic screening with patient. [facility]or[facility] and Horizon.. Routine prenatal labs is needed at new ob visit  Last Pap No results found for: DIAGPAP  Beverly Joseph, CMA 08/11/2024  5:07 PM

## 2024-08-12 ENCOUNTER — Telehealth

## 2024-08-12 DIAGNOSIS — Z3A10 10 weeks gestation of pregnancy: Secondary | ICD-10-CM

## 2024-08-12 DIAGNOSIS — Z3491 Encounter for supervision of normal pregnancy, unspecified, first trimester: Secondary | ICD-10-CM

## 2024-08-12 DIAGNOSIS — Z3490 Encounter for supervision of normal pregnancy, unspecified, unspecified trimester: Secondary | ICD-10-CM | POA: Insufficient documentation

## 2024-08-12 MED ORDER — BLOOD PRESSURE KIT DEVI: 1.0000 | 0 refills | Status: AC | PRN

## 2024-08-12 NOTE — Progress Notes (Signed)
 New OB Intake  I connected with Beverly Joseph  on 08/12/2024 at  8:15 AM EST by MyChart Video Visit and verified that I am speaking with the correct person using two identifiers. Nurse is located at [facility] and pt is located at home.  I discussed the limitations, risks, security and privacy concerns of performing an evaluation and management service by telephone and the availability of in person appointments. I also discussed with the patient that there may be a patient responsible charge related to this service. The patient expressed understanding and agreed to proceed.  I explained I am completing New OB Intake today. We discussed EDD of 03/09/2025 based on LMP of 06/02/2024. Pt is [id number]. I reviewed her allergies, medications and Medical/Surgical/OB history.    Patient Active Problem List   Diagnosis Date Noted   Supervision of low-risk pregnancy 08/12/2024   Anxiety    Anemia affecting pregnancy 06/06/2022   Family history of autism 01/19/2022   Hemorrhoid 11/19/2014   Iron  (Fe) deficiency anemia 01/15/2005     Concerns addressed today  Delivery Plans Plans to deliver at [facility]. Discussed the nature of our practice with multiple providers including residents and students as well as female and female providers. Due to the size of the practice, the delivering provider may not be the same as those providing prenatal care.   Patient is not interested in water birth.  MyChart/Babyscripts MyChart access verified. I explained pt will have some visits in office and some virtually. Babyscripts instructions given and order placed. Patient verifies receipt of registration text/e-mail. Account successfully created and app downloaded. If patient is a candidate for Optimized scheduling, add to sticky note.   Blood Pressure Cuff/Weight Scale Blood pressure cuff ordered for patient to pick-up from [employer]. Explained after first prenatal appt pt will check weekly and document in Babyscripts.    Anatomy US  Explained first scheduled US  will be around 19 weeks. Anatomy US  scheduled for 10/14/24 at 8:00am.  Is patient a CenteringPregnancy candidate?  Declined Declined due to Declined to say  Is patient a Mom+Baby Combined Care candidate?  Not a candidate   If accepted, confirm patient does not intend to move from the area for at least 12 months, then notify Mom+Baby staff  Is patient a candidate for Babyscripts Optimization? Yes, patient declined   First visit review I reviewed new OB appt with patient. Explained pt will be seen by Dr. Eldonna at first visit. Discussed [place] genetic screening with patient. Panorama and Horizon.. Routine prenatal labs is needed  Last Pap No results found for: DIAGPAP  Irene ONEIDA Lee, CMA 08/12/2024  9:47 AM

## 2024-08-12 NOTE — Patient Instructions (Signed)

## 2024-08-18 ENCOUNTER — Encounter: Payer: Self-pay | Admitting: General Practice

## 2024-08-18 ENCOUNTER — Other Ambulatory Visit: Payer: Self-pay

## 2024-08-18 ENCOUNTER — Encounter: Admitting: Obstetrics and Gynecology

## 2024-08-18 ENCOUNTER — Ambulatory Visit (INDEPENDENT_AMBULATORY_CARE_PROVIDER_SITE_OTHER): Admitting: Family Medicine

## 2024-08-18 VITALS — BP 101/65 | HR 125 | Wt 155.7 lb

## 2024-08-18 DIAGNOSIS — Z3491 Encounter for supervision of normal pregnancy, unspecified, first trimester: Secondary | ICD-10-CM | POA: Diagnosis not present

## 2024-08-18 DIAGNOSIS — D508 Other iron deficiency anemias: Secondary | ICD-10-CM

## 2024-08-18 DIAGNOSIS — O219 Vomiting of pregnancy, unspecified: Secondary | ICD-10-CM

## 2024-08-18 DIAGNOSIS — O99011 Anemia complicating pregnancy, first trimester: Secondary | ICD-10-CM | POA: Diagnosis not present

## 2024-08-18 DIAGNOSIS — Z3A11 11 weeks gestation of pregnancy: Secondary | ICD-10-CM | POA: Diagnosis not present

## 2024-08-18 NOTE — Progress Notes (Signed)
 "  INITIAL PRENATAL VISIT  Subjective:   Beverly Joseph is being seen today for her first obstetrical visit.  This is a planned pregnancy. This is a desired pregnancy.  She is at [redacted]w[redacted]d gestation by LMP.  Her obstetrical history is significant for advanced maternal age. Relationship with FOB: spouse, living together. Patient does not intend to breast feed. Pregnancy history fully reviewed.  Patient reports heartburn, nausea, and vomiting.  Indications for ASA therapy (per uptodate) One of the following: Previous pregnancy with preeclampsia, especially early onset and with an adverse outcome Joseph Multifetal gestation Joseph Chronic hypertension Joseph Type 1 or 2 diabetes mellitus Joseph Chronic kidney disease Joseph Autoimmune disease (antiphospholipid syndrome, systemic lupus erythematosus) Joseph  Two or more of the following: Nulliparity Joseph Obesity (body mass index >30 kg/m2) Joseph Family history of preeclampsia in mother or sister Joseph Age >=35 years Yes Sociodemographic characteristics (African American race, low socioeconomic level) Joseph Personal risk factors (eg, previous pregnancy with low birth weight or small for gestational age infant, previous adverse pregnancy outcome [eg, stillbirth], interval >10 years between pregnancies) Joseph  Indications for early GDM screening - HA1C  Review of Systems:   Review of Systems  Objective:    Obstetric History OB History  Gravida Para Term Preterm AB Living  4 2 2  1 2   SAB IAB Ectopic Multiple Live Births  1    2    # Outcome Date GA Lbr Len/2nd Weight Sex Type Anes PTL Lv  4 Current           3 Term 06/06/22 [redacted]w[redacted]d   F Vag-Spont EPI N LIV  2 Term 11/18/14 [redacted]w[redacted]d   M Vag-Spont EPI N LIV  1 SAB 2010            Past Medical History:  Diagnosis Date   Allergy More than a decade   Anemia More than a decade   Anxiety Been a while   Arthritis Lately   Depression Been couple years now   GERD (gastroesophageal reflux disease) Been a while   Headache     Hemorrhoids 2016   Ovarian cyst    UTI (urinary tract infection)     Past Surgical History:  Procedure Laterality Date   left wrist surgery     TONSILLECTOMY     WISDOM TOOTH EXTRACTION      Medications Ordered Prior to Encounter[1]  Allergies[2]  Social History:  reports that she has never smoked. She has never used smokeless tobacco. She reports that she does not drink alcohol and does not use drugs.  Family History  Problem Relation Age of Onset   Hypertension Mother    Depression Mother    Miscarriages / Stillbirths Mother    Hypertension Father    Parkinson's disease Father    Asthma Sister    Varicose Veins Sister    Miscarriages / Stillbirths Sister    ADD / ADHD Son    Learning disabilities Son    Parkinson's disease Paternal Uncle    Parkinson's disease Paternal Grandfather    Thyroid  cancer Neg Hx     The following portions of the patient's history were reviewed and updated as appropriate: allergies, current medications, past family history, past medical history, past social history, past surgical history and problem list.  Review of Systems Review of Systems  Constitutional:  Negative for chills and fever.  HENT:  Negative for congestion and sore throat.   Eyes:  Negative for pain and visual disturbance.  Respiratory:  Negative for cough, chest tightness and shortness of breath.   Cardiovascular:  Negative for chest pain.  Gastrointestinal:  Negative for abdominal pain, diarrhea, nausea and vomiting.  Endocrine: Negative for cold intolerance and heat intolerance.  Genitourinary:  Negative for dysuria and flank pain.  Musculoskeletal:  Negative for back pain.  Skin:  Negative for rash.  Allergic/Immunologic: Negative for food allergies.  Neurological:  Negative for dizziness and light-headedness.  Psychiatric/Behavioral:  Negative for agitation.       Physical Exam:  BP 101/65   Pulse (!) 125   Wt 155 lb 11.2 oz (70.6 kg)   LMP 06/02/2024 (Exact  Date)   BMI 25.91 kg/m  CONSTITUTIONAL: Well-developed, well-nourished female in Joseph acute distress.  HENT:  Normocephalic, atraumatic.  Oropharynx is clear and moist EYES: Conjunctivae normal. Joseph scleral icterus.  NECK: Normal range of motion, supple, Joseph masses.  Normal thyroid .  SKIN: Skin is warm and dry. Joseph rash noted. Not diaphoretic. Joseph erythema. Joseph pallor. MUSCULOSKELETAL: Normal range of motion. Joseph tenderness.  Joseph cyanosis, clubbing, or edema.   NEUROLOGIC: Alert and oriented to person, place, and time. Normal muscle tone coordination.  PSYCHIATRIC: Normal mood and affect. Normal behavior. Normal judgment and thought content. CARDIOVASCULAR: Normal heart rate noted, regular rhythm RESPIRATORY: Clear to auscultation bilaterally. Effort and breath sounds normal, Joseph problems with respiration noted. BREASTS: Symmetric in size. Joseph masses, skin changes, nipple drainage, or lymphadenopathy. ABDOMEN: Soft, normal bowel sounds, Joseph distention noted.  Joseph tenderness, rebound or guarding. Fundal ht: 11 PELVIC: deferred  Fetal Heart Rate (bpm): 172           Assessment:    Pregnancy: [id number]  1. Encounter for supervision of low-risk pregnancy in first trimester (Primary) - PANORAMA PRENATAL TEST - CBC/D/Plt+RPR+Rh+ABO+RubIgG... - Culture, OB Urine - Hemoglobin A1c - Lead, blood (adult age 70 yrs or greater)  2. [redacted] weeks gestation of pregnancy  3. Other iron  deficiency anemia  4. Anemia affecting pregnancy in first trimester Patient reports being on a supplement that was associated with metal ingestion. She would like to be checked Reviewed importance of PNV with ion  - Lead, blood (adult age 65 yrs or greater)  5. Nausea and vomiting during pregnancy Not taking diclegis , does take phenergan     Plan:   Initial labs drawn. Prenatal vitamins. Problem list reviewed and updated. Reviewed in detail the nature of the practice with collaborative care between  Genetic screening  discussed: NIPS  ordered. Role of ultrasound in pregnancy discussed; Anatomy US : ordered. Amniocentesis discussed: not indicated. Follow up in 4 weeks. Weight gain recommendations per IOM guidelines reviewed: underweight/BMI 18.5 or less > 28 - 40 lbs; normal weight/BMI 18.5 - 24.9 > 25 - 35 lbs; overweight/BMI 25 - 29.9 > 15 - 25 lbs; obese/BMI  30 or more > 11 - 20 lbs.  Discussed clinic routines, schedule of care and testing, genetic screening options, involvement of students and residents under the direct supervision of APPs and doctors and presence of female providers. Pt verbalized understanding.  Future Appointments  Date Time Provider Department Center  09/18/2024  8:15 AM Nicholaus Burnard HERO, MD [facility]  10/13/2024  8:15 AM [facility]  10/14/2024  8:00 AM [facility] PROVIDER 1 [facility]  10/14/2024  8:30 AM [facility] US3 [facility]  04/22/2025  9:40 AM Aletha Bene, MD [facility] J[facility] Eldonna Suzen Maryan, MD 08/18/2024 1:46 PM         [1]  Current Outpatient Medications on File Prior to Visit  Medication Sig Dispense Refill   Blood Pressure Monitoring (BLOOD PRESSURE KIT) DEVI 1 each by Does not apply route as needed. 1 each 0   promethazine  (PHENERGAN ) 25 MG tablet Take 1 tablet (25 mg total) by mouth every 6 (six) hours as needed for nausea or vomiting. 30 tablet 0   Doxylamine -Pyridoxine  (DICLEGIS ) 10-10 MG TBEC Take 2 tabs at bedtime. Can add 1 tab in morning & 1 tab in afternoon if needed (Patient not taking: Reported on 08/18/2024) 100 tablet 1   famotidine  (PEPCID ) 20 MG tablet Take 1 tablet (20 mg total) by mouth 2 (two) times daily. (Patient not taking: Reported on 08/18/2024) 30 tablet 0   ferrous sulfate  325 (65 FE) MG tablet Take 1 tablet (325 mg total) by mouth daily with breakfast. (Patient not taking: Reported on 08/18/2024) 90 tablet 3   glycopyrrolate  (ROBINUL ) 1 MG tablet Take 1 tablet (1 mg total) by mouth 3 (three) times daily. (Patient  not taking: Reported on 08/18/2024) 90 tablet 0   Prenat-Fe Poly-Methfol-FA-DHA (VITAFOL  ULTRA) 29-0.6-0.4-200 MG CAPS Take 1 capsule by mouth daily. (Patient not taking: Reported on 08/18/2024) 30 capsule 11   Joseph current facility-administered medications on file prior to visit.  [2]  Allergies Allergen Reactions   Latex     Makes her sneeze   Penicillins Nausea And Vomiting   "

## 2024-08-19 ENCOUNTER — Ambulatory Visit: Payer: Self-pay | Admitting: Family Medicine

## 2024-08-19 ENCOUNTER — Encounter: Payer: Self-pay | Admitting: *Deleted

## 2024-08-19 DIAGNOSIS — Z3491 Encounter for supervision of normal pregnancy, unspecified, first trimester: Secondary | ICD-10-CM

## 2024-08-19 LAB — CBC/D/PLT+RPR+RH+ABO+RUBIGG...
Antibody Screen: NEGATIVE
Basophils Absolute: 0 x10E3/uL (ref 0.0–0.2)
Basos: 0 %
EOS (ABSOLUTE): 0.1 x10E3/uL (ref 0.0–0.4)
Eos: 1 %
HCV Ab: NONREACTIVE
HIV Screen 4th Generation wRfx: NONREACTIVE
Hematocrit: 37.6 % (ref 34.0–46.6)
Hemoglobin: 11.3 g/dL (ref 11.1–15.9)
Hepatitis B Surface Ag: NEGATIVE
Immature Grans (Abs): 0 x10E3/uL (ref 0.0–0.1)
Immature Granulocytes: 0 %
Lymphocytes Absolute: 2.1 x10E3/uL (ref 0.7–3.1)
Lymphs: 22 %
MCH: 26.7 pg (ref 26.6–33.0)
MCHC: 30.1 g/dL — ABNORMAL LOW (ref 31.5–35.7)
MCV: 89 fL (ref 79–97)
Monocytes Absolute: 0.5 x10E3/uL (ref 0.1–0.9)
Monocytes: 5 %
Neutrophils Absolute: 7 x10E3/uL (ref 1.4–7.0)
Neutrophils: 72 %
Platelets: 266 x10E3/uL (ref 150–450)
RBC: 4.24 x10E6/uL (ref 3.77–5.28)
RDW: 14.5 % (ref 11.7–15.4)
RPR Ser Ql: NONREACTIVE
Rh Factor: POSITIVE
Rubella Antibodies, IGG: 16 {index}
WBC: 9.7 x10E3/uL (ref 3.4–10.8)

## 2024-08-19 LAB — HEMOGLOBIN A1C
Est. average glucose Bld gHb Est-mCnc: 111 mg/dL
Hgb A1c MFr Bld: 5.5 % (ref 4.8–5.6)

## 2024-08-19 LAB — LEAD, BLOOD (ADULT >= 16 YRS): Lead-Whole Blood: <1 ug/dL (ref 0.0–3.4)

## 2024-08-19 LAB — HCV INTERPRETATION

## 2024-08-20 LAB — CULTURE, OB URINE

## 2024-08-20 LAB — URINE CULTURE, OB REFLEX: Organism ID, Bacteria: NO GROWTH

## 2024-08-25 LAB — PANORAMA PRENATAL TEST FULL PANEL:PANORAMA TEST PLUS 5 ADDITIONAL MICRODELETIONS: FETAL FRACTION: 16.2

## 2024-09-18 ENCOUNTER — Ambulatory Visit: Payer: Self-pay | Admitting: Obstetrics and Gynecology

## 2024-09-18 ENCOUNTER — Other Ambulatory Visit: Payer: Self-pay

## 2024-09-18 VITALS — BP 96/64 | HR 105 | Wt 159.2 lb

## 2024-09-18 DIAGNOSIS — Z3492 Encounter for supervision of normal pregnancy, unspecified, second trimester: Secondary | ICD-10-CM | POA: Diagnosis not present

## 2024-09-18 DIAGNOSIS — Z789 Other specified health status: Secondary | ICD-10-CM

## 2024-09-18 DIAGNOSIS — D508 Other iron deficiency anemias: Secondary | ICD-10-CM | POA: Diagnosis not present

## 2024-09-18 DIAGNOSIS — Z3A15 15 weeks gestation of pregnancy: Secondary | ICD-10-CM

## 2024-09-18 DIAGNOSIS — F419 Anxiety disorder, unspecified: Secondary | ICD-10-CM

## 2024-09-18 NOTE — Progress Notes (Signed)
 "  PRENATAL VISIT NOTE  Subjective:  Beverly Joseph is a 39 y.o. [phone] at [redacted]w[redacted]d being seen today for ongoing prenatal care.  She is currently monitored for the following issues for this low-risk pregnancy and has Family history of autism; Anemia affecting pregnancy; Hemorrhoid; Iron  (Fe) deficiency anemia; Anxiety; Supervision of low-risk pregnancy; and No blood products on their problem list.  Patient reports weak feeling.  Contractions: Not present. Vag. Bleeding: None.   . Denies leaking of fluid.   The following portions of the patient's history were reviewed and updated as appropriate: allergies, current medications, past family history, past medical history, past social history, past surgical history and problem list.   Objective:   Vitals:   09/18/24 0821  BP: 96/64  Pulse: (!) 105  Weight: 159 lb 3.2 oz (72.2 kg)    Fetal Status:  Fetal Heart Rate (bpm): 161        General: Alert, oriented and cooperative. Patient is in no acute distress.  Skin: Skin is warm and dry. No rash noted.   Cardiovascular: Normal heart rate noted  Respiratory: Normal respiratory effort, no problems with respiration noted  Abdomen: Soft, gravid, appropriate for gestational age.  Pain/Pressure: Absent     Pelvic: Cervical exam deferred        Extremities: Normal range of motion.  Edema: None  Mental Status: Normal mood and affect. Normal behavior. Normal judgment and thought content.      06/11/2024    1:55 PM 04/21/2024    9:51 AM  Depression screen PHQ 2/9  Decreased Interest 3 1  Down, Depressed, Hopeless 3 2  PHQ - 2 Score 6 3  Altered sleeping  2  Tired, decreased energy  2  Change in appetite  2  Feeling bad or failure about yourself   0  Trouble concentrating  1  Moving slowly or fidgety/restless  0  Suicidal thoughts  0  PHQ-9 Score  10   Difficult doing work/chores  Somewhat difficult     Data saved with a previous flowsheet row definition        04/21/2024    9:52 AM  GAD 7  : Generalized Anxiety Score  Nervous, Anxious, on Edge 2   Control/stop worrying 0   Worry too much - different things 2   Trouble relaxing 2   Restless 0   Easily annoyed or irritable 2   Afraid - awful might happen 2   Total GAD 7 Score 10  Anxiety Difficulty Somewhat difficult     Data saved with a previous flowsheet row definition    Assessment and Plan:  Pregnancy: [id number] at [redacted]w[redacted]d  1. Encounter for supervision of low-risk pregnancy in second trimester (Primary) Reviewed screening modalities for fetus, no screening modality for autism  2. Anxiety Reports stable  3. Other iron  deficiency anemia Cont iron   4. [redacted] weeks gestation of pregnancy   Preterm labor symptoms and general obstetric precautions including but not limited to vaginal bleeding, contractions, leaking of fluid and fetal movement were reviewed in detail with the patient. Please refer to After Visit Summary for other counseling recommendations.   Return in about 1 month (around 10/19/2024) for low OB.  Future Appointments  Date Time Provider Department Center  10/13/2024  8:15 AM Fredirick Glenys RAMAN, MD [facility]  10/14/2024  8:00 AM [facility] PROVIDER 1 [facility]  10/14/2024  8:30 AM [facility] US3 [facility]  04/22/2025  9:40 AM Aletha Bene, MD [facility]  Burnard CHRISTELLA Moats, MD  "

## 2024-09-19 ENCOUNTER — Ambulatory Visit: Payer: Self-pay | Admitting: Obstetrics and Gynecology

## 2024-09-19 LAB — AFP, SERUM, OPEN SPINA BIFIDA
AFP MoM: 0.63
AFP Value: 19.8 ng/mL
Gest. Age on Collection Date: 15.3 wk
Maternal Age At EDD: 37.6 a
OSBR Risk 1 IN: 10000
Test Results:: NEGATIVE
Weight: 159 [lb_av]

## 2024-09-19 LAB — IRON,TIBC AND FERRITIN PANEL
Ferritin: 19 ng/mL (ref 15–150)
Iron Saturation: 28 % (ref 15–55)
Iron: 114 ug/dL (ref 27–159)
Total Iron Binding Capacity: 412 ug/dL (ref 250–450)
UIBC: 298 ug/dL (ref 131–425)

## 2024-10-13 ENCOUNTER — Encounter: Payer: Self-pay | Admitting: Family Medicine

## 2024-10-14 ENCOUNTER — Ambulatory Visit

## 2024-10-14 ENCOUNTER — Other Ambulatory Visit

## 2024-10-14 DIAGNOSIS — Z3492 Encounter for supervision of normal pregnancy, unspecified, second trimester: Secondary | ICD-10-CM

## 2025-04-22 ENCOUNTER — Encounter: Admitting: Family Medicine
